# Patient Record
Sex: Male | Born: 1960
Health system: Southern US, Community
[De-identification: ages and names within clinical notes are randomized; demographics above are authoritative.]

## PROBLEM LIST (undated history)

## (undated) DIAGNOSIS — M1990 Unspecified osteoarthritis, unspecified site: Secondary | ICD-10-CM

## (undated) DIAGNOSIS — E785 Hyperlipidemia, unspecified: Secondary | ICD-10-CM

## (undated) HISTORY — DX: Hyperlipidemia, unspecified: E78.5

## (undated) HISTORY — DX: Unspecified osteoarthritis, unspecified site: M19.90

## (undated) HISTORY — PX: APPENDECTOMY: SHX54

---

## 2013-03-13 ENCOUNTER — Encounter: Payer: Self-pay | Admitting: Sports Medicine

## 2013-03-13 ENCOUNTER — Ambulatory Visit (INDEPENDENT_AMBULATORY_CARE_PROVIDER_SITE_OTHER): Payer: Managed Care, Other (non HMO) | Admitting: Sports Medicine

## 2013-03-13 VITALS — BP 112/74 | HR 63 | Wt 282.0 lb

## 2013-03-13 DIAGNOSIS — Z299 Encounter for prophylactic measures, unspecified: Secondary | ICD-10-CM

## 2013-03-13 DIAGNOSIS — R55 Syncope and collapse: Secondary | ICD-10-CM

## 2013-03-13 DIAGNOSIS — Z0000 Encounter for general adult medical examination without abnormal findings: Secondary | ICD-10-CM | POA: Insufficient documentation

## 2013-03-13 DIAGNOSIS — Z8619 Personal history of other infectious and parasitic diseases: Secondary | ICD-10-CM | POA: Insufficient documentation

## 2013-03-13 DIAGNOSIS — E669 Obesity, unspecified: Secondary | ICD-10-CM

## 2013-03-13 MED ORDER — PHENTERMINE HCL 37.5 MG PO CAPS: 37.5000 mg | ORAL_CAPSULE | ORAL | Status: DC

## 2013-03-13 NOTE — Assessment & Plan Note (Signed)
Symptoms sound predominately vasovagal. He recalls episodes of feeling flushed, dizzy, without any palpitations, chest pain, or other symptoms. Symptoms are not exertional.

## 2013-03-13 NOTE — Progress Notes (Signed)
  Subjective:    CC: Establish care.   HPI:  History of Lyme disease: Had polyarthralgias, was treated by antibiotics by a physician years ago, and symptoms resolved. Never had any blood tests.  Obesity: Has tried multiple diets and exercises to lose weight but is unable, wondering if phentermine would be acceptable.  Syncope: This is having twice in the past several years, he will get a sensation of flushing, did feel dizzy like he's going to pass out but never has palpitations, skipped beats, chest pain, pressure, nausea, shortness of breath. Symptoms are nonexertional, they're not relieved by rest. He does not recall any emotional stimuli during these episodes. Symptoms are brief, he has not had one in a long time. Past medical history, Surgical history, Family history not pertinant except as noted below, Social history, Allergies, and medications have been entered into the medical record, reviewed, and no changes needed.   Review of Systems: No headache, visual changes, nausea, vomiting, diarrhea, constipation, dizziness, abdominal pain, skin rash, fevers, chills, night sweats, swollen lymph nodes, weight loss, chest pain, body aches, joint swelling, muscle aches, shortness of breath, mood changes, visual or auditory hallucinations.  Objective:    General: Well Developed, well nourished, and in no acute distress.  Neuro: Alert and oriented x3, extra-ocular muscles intact, sensation grossly intact.  HEENT: Normocephalic, atraumatic, pupils equal round reactive to light, neck supple, no masses, no lymphadenopathy, thyroid nonpalpable.  Skin: Warm and dry, no rashes noted.  Cardiac: Regular rate and rhythm, no murmurs rubs or gallops.  Respiratory: Clear to auscultation bilaterally. Not using accessory muscles, speaking in full sentences.  Abdominal: Soft, nontender, nondistended, positive bowel sounds, no masses, no organomegaly.  Musculoskeletal: Shoulder, elbow, wrist, hip, knee, ankle  stable, and with full range of motion. Impression and Recommendations:    The patient was counselled, risk factors were discussed, anticipatory guidance given.

## 2013-03-13 NOTE — Assessment & Plan Note (Signed)
Checking routine bloodwork. 

## 2013-03-13 NOTE — Assessment & Plan Note (Signed)
Phentermine, nutritionist visit. Return monthly for weight checks.

## 2013-03-13 NOTE — Assessment & Plan Note (Signed)
Has never actually had Lyme titers done. Checking titers for Lyme, Rickettsia, Erhlichia. He does recall going on an antibiotic for 21 days.

## 2013-03-14 LAB — COMPREHENSIVE METABOLIC PANEL
ALT: 21 U/L (ref 0–53)
Albumin: 4.1 g/dL (ref 3.5–5.2)
CO2: 28 mEq/L (ref 19–32)
Calcium: 9 mg/dL (ref 8.4–10.5)
Chloride: 105 mEq/L (ref 96–112)
Glucose, Bld: 89 mg/dL (ref 70–99)
Potassium: 4.4 mEq/L (ref 3.5–5.3)
Sodium: 139 mEq/L (ref 135–145)
Total Protein: 6.3 g/dL (ref 6.0–8.3)

## 2013-03-14 LAB — CBC
HCT: 41.7 % (ref 39.0–52.0)
Hemoglobin: 14.6 g/dL (ref 13.0–17.0)
MCH: 31.6 pg (ref 26.0–34.0)
MCHC: 35 g/dL (ref 30.0–36.0)
MCV: 90.3 fL (ref 78.0–100.0)
Platelets: 239 K/uL (ref 150–400)
RBC: 4.62 MIL/uL (ref 4.22–5.81)
RDW: 14.6 % (ref 11.5–15.5)
WBC: 8.1 10*3/uL (ref 4.0–10.5)

## 2013-03-14 LAB — LIPID PANEL
Cholesterol: 204 mg/dL — ABNORMAL HIGH (ref 0–200)
HDL: 36 mg/dL — ABNORMAL LOW (ref >39)
LDL Cholesterol: 130 mg/dL — ABNORMAL HIGH (ref 0–99)
Total CHOL/HDL Ratio: 5.7 ratio
Triglycerides: 189 mg/dL — ABNORMAL HIGH (ref <150)
VLDL: 38 mg/dL (ref 0–40)

## 2013-03-14 LAB — HEMOGLOBIN A1C
Hgb A1c MFr Bld: 5.6 % (ref <5.7)
Mean Plasma Glucose: 114 mg/dL (ref <117)

## 2013-03-14 LAB — VITAMIN D 25 HYDROXY (VIT D DEFICIENCY, FRACTURES): Vit D, 25-Hydroxy: 42 ng/mL (ref 30–89)

## 2013-03-14 LAB — COMPREHENSIVE METABOLIC PANEL WITH GFR
AST: 17 U/L (ref 0–37)
Alkaline Phosphatase: 72 U/L (ref 39–117)
BUN: 13 mg/dL (ref 6–23)
Creat: 0.96 mg/dL (ref 0.50–1.35)
Total Bilirubin: 0.7 mg/dL (ref 0.3–1.2)

## 2013-03-14 LAB — TSH: TSH: 1.377 u[IU]/mL (ref 0.350–4.500)

## 2013-03-16 ENCOUNTER — Encounter: Payer: Self-pay | Admitting: Sports Medicine

## 2013-03-16 DIAGNOSIS — E291 Testicular hypofunction: Secondary | ICD-10-CM | POA: Insufficient documentation

## 2013-03-16 DIAGNOSIS — E785 Hyperlipidemia, unspecified: Secondary | ICD-10-CM | POA: Insufficient documentation

## 2013-03-16 LAB — ROCKY MTN SPOTTED FVR ABS PNL(IGG+IGM)
RMSF IgG: 1.44 IV — ABNORMAL HIGH
RMSF IgM: 0.27 IV

## 2013-03-16 LAB — EHRLICHIA ANTIBODY PANEL
E chaffeensis (HGE) Ab, IgG: NEGATIVE
E chaffeensis (HGE) Ab, IgM: NEGATIVE

## 2013-03-16 LAB — TESTOSTERONE, FREE, TOTAL, SHBG
Sex Hormone Binding: 22 nmol/L (ref 13–71)
Testosterone, Free: 35.3 pg/mL — ABNORMAL LOW (ref 47.0–244.0)
Testosterone-% Free: 2.3 % (ref 1.6–2.9)
Testosterone: 152 ng/dL — ABNORMAL LOW (ref 300–890)

## 2013-03-16 LAB — B. BURGDORFI ANTIBODIES: B burgdorferi Ab IgG+IgM: 0.29 {ISR}

## 2013-03-17 ENCOUNTER — Ambulatory Visit (INDEPENDENT_AMBULATORY_CARE_PROVIDER_SITE_OTHER): Payer: Managed Care, Other (non HMO) | Admitting: Sports Medicine

## 2013-03-17 ENCOUNTER — Encounter: Payer: Self-pay | Admitting: Sports Medicine

## 2013-03-17 VITALS — BP 115/78 | HR 87 | Wt 277.0 lb

## 2013-03-17 DIAGNOSIS — E291 Testicular hypofunction: Secondary | ICD-10-CM

## 2013-03-17 DIAGNOSIS — Z299 Encounter for prophylactic measures, unspecified: Secondary | ICD-10-CM

## 2013-03-17 DIAGNOSIS — E785 Hyperlipidemia, unspecified: Secondary | ICD-10-CM

## 2013-03-17 MED ORDER — TESTOSTERONE CYPIONATE 200 MG/ML IM SOLN
200.0000 mg | Freq: Once | INTRAMUSCULAR | Status: AC
  Administered 2013-03-17: 200 mg via INTRAMUSCULAR

## 2013-03-17 NOTE — Assessment & Plan Note (Addendum)
Starting testosterone supplementations. Testosterone 200 mg intramuscular Q2 weeks. Recheck in one month. Risks and benefits advised.  Increasing to 300 mg every 2 weeks, recheck in 4 weeks.

## 2013-03-17 NOTE — Patient Instructions (Addendum)
Exercise prescription:  You should adjust the intensity of your exercise based on your heart rate. The [employer] sports medicine recommends keeping your heart rate between 70-80% of its maximum for 30 minutes, 3-5 times per week. Maximum heart rate = (220 - age). Multiply this number by 0.75 to get your goal heart rate. If lower, then increase the intensity of your exercise. If the number is higher, you may decrease the intensity of your exercise.   Fat and Cholesterol Control Diet Cholesterol levels in your body are determined significantly by your diet. Cholesterol levels may also be related to heart disease. The following material helps to explain this relationship and discusses what you can do to help keep your heart healthy. Not all cholesterol is bad. Low-density lipoprotein (LDL) cholesterol is the "bad" cholesterol. It may cause fatty deposits to build up inside your arteries. High-density lipoprotein (HDL) cholesterol is "good." It helps to remove the "bad" LDL cholesterol from your blood. Cholesterol is a very important risk factor for heart disease. Other risk factors are high blood pressure, smoking, stress, heredity, and weight. The heart muscle gets its supply of blood through the coronary arteries. If your LDL cholesterol is high and your HDL cholesterol is low, you are at risk for having fatty deposits build up in your coronary arteries. This leaves less room through which blood can flow. Without sufficient blood and oxygen, the heart muscle cannot function properly and you may feel chest pains (angina pectoris). When a coronary artery closes up entirely, a part of the heart muscle may die causing a heart attack (myocardial infarction). CHECKING CHOLESTEROL When your caregiver sends your blood to a lab to be examined for cholesterol, a complete lipid (fat) profile may be done. With this test, the total amount of cholesterol and levels of LDL and HDL are determined. Triglycerides  are a type of fat that circulates in the blood. They can also be used to determine heart disease risk. The list below describes what the numbers should be: Test: Total Cholesterol.  Less than 200 mg/dl. Test: LDL "bad cholesterol."  Less than 100 mg/dl.  Less than 70 mg/dl if you are at very high risk of a heart attack or sudden cardiac death. Test: HDL "good cholesterol."  Greater than 50 mg/dl for women.  Greater than 40 mg/dl for men. Test: Triglycerides.  Less than 150 mg/dl. CONTROLLING CHOLESTEROL WITH DIET Although exercise and lifestyle factors are important, your diet is key. That is because certain foods are known to raise cholesterol and others to lower it. The goal is to balance foods for their effect on cholesterol and more importantly, to replace saturated and trans fat with other types of fat, such as monounsaturated fat, polyunsaturated fat, and omega-3 fatty acids. On average, a person should consume no more than 15 to 17 g of saturated fat daily. Saturated and trans fats are considered "bad" fats, and they will raise LDL cholesterol. Saturated fats are primarily found in animal products such as meats, butter, and cream. However, that does not mean you need to give up all your favorite foods. Today, there are good tasting, low-fat, low-cholesterol substitutes for most of the things you like to eat. Choose low-fat or nonfat alternatives. Choose round or loin cuts of red meat. These types of cuts are lowest in fat and cholesterol. Chicken (without the skin), fish, veal, and ground [place] breast are great choices. Eliminate fatty meats, such as hot dogs and salami. Even shellfish have little or no  saturated fat. Have a 3 oz (85 g) portion when you eat lean meat, poultry, or fish. Trans fats are also called "partially hydrogenated oils." They are oils that have been scientifically manipulated so that they are solid at room temperature resulting in a longer shelf life and improved  taste and texture of foods in which they are added. Trans fats are found in stick margarine, some tub margarines, cookies, crackers, and baked goods.  When baking and cooking, oils are a great substitute for butter. The monounsaturated oils are especially beneficial since it is believed they lower LDL and raise HDL. The oils you should avoid entirely are saturated tropical oils, such as coconut and palm.  Remember to eat a lot from food groups that are naturally free of saturated and trans fat, including fish, fruit, vegetables, beans, grains (barley, rice, couscous, bulgur wheat), and pasta (without cream sauces).  IDENTIFYING FOODS THAT LOWER CHOLESTEROL  Soluble fiber may lower your cholesterol. This type of fiber is found in fruits such as apples, vegetables such as broccoli, potatoes, and carrots, legumes such as beans, peas, and lentils, and grains such as barley. Foods fortified with plant sterols (phytosterol) may also lower cholesterol. You should eat at least 2 g per day of these foods for a cholesterol lowering effect.  Read package labels to identify low-saturated fats, trans fat free, and low-fat foods at the supermarket. Select cheeses that have only 2 to 3 g saturated fat per ounce. Use a heart-healthy tub margarine that is free of trans fats or partially hydrogenated oil. When buying baked goods (cookies, crackers), avoid partially hydrogenated oils. Breads and muffins should be made from whole grains (whole-wheat or whole oat flour, instead of "flour" or "enriched flour"). Buy non-creamy canned soups with reduced salt and no added fats.  FOOD PREPARATION TECHNIQUES  Never deep-fry. If you must fry, either stir-fry, which uses very little fat, or use non-stick cooking sprays. When possible, broil, bake, or roast meats, and steam vegetables. Instead of putting butter or margarine on vegetables, use lemon and herbs, applesauce, and cinnamon (for squash and sweet potatoes), nonfat yogurt, salsa,  and low-fat dressings for salads.  LOW-SATURATED FAT / LOW-FAT FOOD SUBSTITUTES Meats / Saturated Fat (g)  Avoid: Steak, marbled (3 oz/85 g) / 11 g  Choose: Steak, lean (3 oz/85 g) / 4 g  Avoid: Hamburger (3 oz/85 g) / 7 g  Choose: Hamburger, lean (3 oz/85 g) / 5 g  Avoid: Ham (3 oz/85 g) / 6 g  Choose: Ham, lean cut (3 oz/85 g) / 2.4 g  Avoid: Chicken, with skin, dark meat (3 oz/85 g) / 4 g  Choose: Chicken, skin removed, dark meat (3 oz/85 g) / 2 g  Avoid: Chicken, with skin, light meat (3 oz/85 g) / 2.5 g  Choose: Chicken, skin removed, light meat (3 oz/85 g) / 1 g Dairy / Saturated Fat (g)  Avoid: Whole milk (1 cup) / 5 g  Choose: Low-fat milk, 2% (1 cup) / 3 g  Choose: Low-fat milk, 1% (1 cup) / 1.5 g  Choose: Skim milk (1 cup) / 0.3 g  Avoid: Hard cheese (1 oz/28 g) / 6 g  Choose: Skim milk cheese (1 oz/28 g) / 2 to 3 g  Avoid: Cottage cheese, 4% fat (1 cup) / 6.5 g  Choose: Low-fat cottage cheese, 1% fat (1 cup) / 1.5 g  Avoid: Ice cream (1 cup) / 9 g  Choose: Sherbet (1 cup) / 2.5 g  Choose: Nonfat frozen yogurt (1 cup) / 0.3 g  Choose: Frozen fruit bar / trace  Avoid: Whipped cream (1 tbs) / 3.5 g  Choose: Nondairy whipped topping (1 tbs) / 1 g Condiments / Saturated Fat (g)  Avoid: Mayonnaise (1 tbs) / 2 g  Choose: Low-fat mayonnaise (1 tbs) / 1 g  Avoid: Butter (1 tbs) / 7 g  Choose: Extra light margarine (1 tbs) / 1 g  Avoid: Coconut oil (1 tbs) / 11.8 g  Choose: Olive oil (1 tbs) / 1.8 g  Choose: Corn oil (1 tbs) / 1.7 g  Choose: Safflower oil (1 tbs) / 1.2 g  Choose: Sunflower oil (1 tbs) / 1.4 g  Choose: Soybean oil (1 tbs) / 2.4 g  Choose: Canola oil (1 tbs) / 1 g Document Released: 10/01/2005 Document Revised: 12/24/2011 Document Reviewed: 03/22/2011 Exit[employer] Patient Information 2014 [place], [place].

## 2013-03-17 NOTE — Progress Notes (Signed)
  Subjective:    CC: Followup  HPI: Hypogonadism: does have fatigue, poor sex drive, no erectile dysfunction. Does desire to proceed with testosterone supplementation.  Hyperlipidemia: Like to try diet and exercise first.  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  Past medical history, Surgical history, Family history not pertinant except as noted below, Social history, Allergies, and medications have been entered into the medical record, reviewed, and no changes needed.   Review of Systems: No fevers, chills, night sweats, weight loss, chest pain, or shortness of breath.   Objective:    General: Well Developed, well nourished, and in no acute distress.  Neuro: Alert and oriented x3, extra-ocular muscles intact, sensation grossly intact.  HEENT: Normocephalic, atraumatic, pupils equal round reactive to light, neck supple, no masses, no lymphadenopathy, thyroid nonpalpable.  Skin: Warm and dry, no rashes. Cardiac: Regular rate and rhythm, no murmurs rubs or gallops, no lower extremity edema.  Respiratory: Clear to auscultation bilaterally. Not using accessory muscles, speaking in full sentences. Impression and Recommendations:

## 2013-03-17 NOTE — Assessment & Plan Note (Signed)
Diet, exercise prescription. Recheck in 3 months.

## 2013-03-31 ENCOUNTER — Encounter: Payer: Self-pay | Admitting: *Deleted

## 2013-03-31 ENCOUNTER — Ambulatory Visit (INDEPENDENT_AMBULATORY_CARE_PROVIDER_SITE_OTHER): Payer: Managed Care, Other (non HMO) | Admitting: Sports Medicine

## 2013-03-31 VITALS — BP 109/73 | HR 85 | Wt 274.0 lb

## 2013-03-31 DIAGNOSIS — E291 Testicular hypofunction: Secondary | ICD-10-CM

## 2013-03-31 MED ORDER — TESTOSTERONE CYPIONATE 200 MG/ML IM SOLN
200.0000 mg | Freq: Once | INTRAMUSCULAR | Status: AC
  Administered 2013-03-31: 200 mg via INTRAMUSCULAR

## 2013-03-31 NOTE — Progress Notes (Signed)
I was present for all essential parts of this visit and procedure. Ihor Austin. Benjamin Stain, M.D. testosterone injection.

## 2013-03-31 NOTE — Assessment & Plan Note (Signed)
Testosterone injection given, repeat in 2 weeks.

## 2013-04-08 LAB — CBC
HCT: 39.7 % (ref 39.0–52.0)
Hemoglobin: 13.9 g/dL (ref 13.0–17.0)
MCH: 30.9 pg (ref 26.0–34.0)
MCHC: 35 g/dL (ref 30.0–36.0)
MCV: 88.2 fL (ref 78.0–100.0)
Platelets: 221 10*3/uL (ref 150–400)
RBC: 4.5 MIL/uL (ref 4.22–5.81)
RDW: 14.3 % (ref 11.5–15.5)
WBC: 10.4 K/uL (ref 4.0–10.5)

## 2013-04-09 LAB — TESTOSTERONE, FREE, TOTAL, SHBG
Sex Hormone Binding: 22 nmol/L (ref 13–71)
Testosterone, Free: 79.6 pg/mL (ref 47.0–244.0)
Testosterone-% Free: 2.5 % (ref 1.6–2.9)
Testosterone: 324 ng/dL (ref 300–890)

## 2013-04-09 NOTE — Addendum Note (Signed)
Addended by: Monica Becton on: 04/09/2013 04:56 PM   Modules accepted: Orders

## 2013-04-10 ENCOUNTER — Ambulatory Visit (INDEPENDENT_AMBULATORY_CARE_PROVIDER_SITE_OTHER): Payer: Managed Care, Other (non HMO) | Admitting: Sports Medicine

## 2013-04-10 ENCOUNTER — Encounter: Payer: Self-pay | Admitting: Sports Medicine

## 2013-04-10 VITALS — BP 117/78 | HR 81 | Wt 273.0 lb

## 2013-04-10 DIAGNOSIS — E669 Obesity, unspecified: Secondary | ICD-10-CM

## 2013-04-10 DIAGNOSIS — J029 Acute pharyngitis, unspecified: Secondary | ICD-10-CM | POA: Insufficient documentation

## 2013-04-10 MED ORDER — PHENTERMINE HCL 37.5 MG PO CAPS: 37.5000 mg | ORAL_CAPSULE | ORAL | Status: DC

## 2013-04-10 NOTE — Progress Notes (Signed)
  Subjective:    CC: Followup  HPI: Obesity: 10 pound weight loss, doing well, no side effects from the medications.  Sore throat: present for a few days, pain is localized in the throat, no sick contacts, pain is mild, persistent, no fevers, chills, or GI symptoms.  Past medical history, Surgical history, Family history not pertinant except as noted below, Social history, Allergies, and medications have been entered into the medical record, reviewed, and no changes needed.   Review of Systems: No fevers, chills, night sweats, weight loss, chest pain, or shortness of breath.   Objective:    General: Well Developed, well nourished, and in no acute distress.  Neuro: Alert and oriented x3, extra-ocular muscles intact, sensation grossly intact.  HEENT: Normocephalic, atraumatic, pupils equal round reactive to light, neck supple, no masses, no lymphadenopathy, thyroid nonpalpable. Nasopharynx and extraocular canals are unremarkable, oropharynx is mildly erythematous, but there is no tonsillitis or exudates. Skin: Warm and dry, no rashes. Cardiac: Regular rate and rhythm, no murmurs rubs or gallops, no lower extremity edema.  Respiratory: Clear to auscultation bilaterally. Not using accessory muscles, speaking in full sentences. Impression and Recommendations:

## 2013-04-10 NOTE — Assessment & Plan Note (Signed)
Approximately 10 pound weight loss. Refilled phentermine. Return in one month for weight check and refills.

## 2013-04-10 NOTE — Assessment & Plan Note (Signed)
Is to be viral pharyngitis. Recommended over-the-counter analgesics, return as needed for this.

## 2013-04-14 ENCOUNTER — Ambulatory Visit: Payer: Managed Care, Other (non HMO) | Admitting: Sports Medicine

## 2013-04-14 ENCOUNTER — Encounter: Payer: Self-pay | Admitting: *Deleted

## 2013-04-14 ENCOUNTER — Ambulatory Visit (INDEPENDENT_AMBULATORY_CARE_PROVIDER_SITE_OTHER): Payer: Managed Care, Other (non HMO) | Admitting: Sports Medicine

## 2013-04-14 VITALS — BP 116/81 | HR 87

## 2013-04-14 DIAGNOSIS — E291 Testicular hypofunction: Secondary | ICD-10-CM

## 2013-04-14 MED ORDER — TESTOSTERONE CYPIONATE 200 MG/ML IM SOLN
300.0000 mg | Freq: Once | INTRAMUSCULAR | Status: AC
  Administered 2013-04-14: 300 mg via INTRAMUSCULAR

## 2013-04-14 NOTE — Assessment & Plan Note (Signed)
Testosterone injection as above. 

## 2013-04-14 NOTE — Progress Notes (Signed)
  Subjective:    Patient ID: Jose Jimenez, male    DOB: 06-Feb-1961, 52 y.o.   MRN: [id number] Testosterone injection given IM.  .  No complications.  Donne Anon, CMA HPI    Review of Systems     Objective:   Physical Exam        Assessment & Plan:  I was present for all essential parts of this visit and procedure. Ihor Austin. Benjamin Stain, M.D.

## 2013-04-27 ENCOUNTER — Other Ambulatory Visit: Payer: Self-pay | Admitting: *Deleted

## 2013-04-27 DIAGNOSIS — E291 Testicular hypofunction: Secondary | ICD-10-CM

## 2013-04-28 ENCOUNTER — Ambulatory Visit (INDEPENDENT_AMBULATORY_CARE_PROVIDER_SITE_OTHER): Payer: Managed Care, Other (non HMO) | Admitting: Sports Medicine

## 2013-04-28 VITALS — BP 109/76 | HR 93 | Wt 265.0 lb

## 2013-04-28 DIAGNOSIS — E291 Testicular hypofunction: Secondary | ICD-10-CM

## 2013-04-28 MED ORDER — TESTOSTERONE CYPIONATE 200 MG/ML IM SOLN
300.0000 mg | Freq: Once | INTRAMUSCULAR | Status: AC
  Administered 2013-04-28: 300 mg via INTRAMUSCULAR

## 2013-04-28 NOTE — Progress Notes (Signed)
  Subjective:    Patient ID: Jose Jimenez, male    DOB: [birth year], 52 y.o.   MRN: [id number] testosterone given IM.  No complications.  Donne Anon, CMA HPI    Review of Systems     Objective:   Physical Exam        Assessment & Plan:   I was present for all essential parts of this visit and procedure. Ihor Austin. Benjamin Stain, M.D.

## 2013-04-28 NOTE — Assessment & Plan Note (Signed)
Return in 2 weeks for repeat injection

## 2013-05-08 ENCOUNTER — Ambulatory Visit (INDEPENDENT_AMBULATORY_CARE_PROVIDER_SITE_OTHER): Payer: Managed Care, Other (non HMO) | Admitting: Sports Medicine

## 2013-05-08 ENCOUNTER — Encounter: Payer: Self-pay | Admitting: Sports Medicine

## 2013-05-08 VITALS — BP 120/87 | HR 82 | Wt 263.0 lb

## 2013-05-08 DIAGNOSIS — E291 Testicular hypofunction: Secondary | ICD-10-CM

## 2013-05-08 DIAGNOSIS — E669 Obesity, unspecified: Secondary | ICD-10-CM

## 2013-05-08 LAB — TESTOSTERONE, FREE, TOTAL, SHBG
Sex Hormone Binding: 24 nmol/L (ref 13–71)
Testosterone, Free: 208.3 pg/mL (ref 47.0–244.0)
Testosterone-% Free: 2.7 % (ref 1.6–2.9)
Testosterone: 779 ng/dL (ref 300–890)

## 2013-05-08 MED ORDER — PHENTERMINE HCL 37.5 MG PO CAPS
37.5000 mg | ORAL_CAPSULE | ORAL | Status: DC
Start: 2013-05-08 — End: 2013-07-27

## 2013-05-08 NOTE — Assessment & Plan Note (Signed)
Continue testosterone 300 mg. Symptomatically he is improving. Improve sex drive, energy, erections. At the next visit I would like him to do the shot himself, and we can then transition him to home injections.

## 2013-05-08 NOTE — Progress Notes (Signed)
  Subjective:    CC: Followup  HPI: Male hypogonadism: Most recent testosterone level was 700, he feels increased energy, increased sex drive, and improved erections.  Obesity: So far has had 20 pound weight loss in the last 2 months on phentermine. No adverse effects.  Past medical history, Surgical history, Family history not pertinant except as noted below, Social history, Allergies, and medications have been entered into the medical record, reviewed, and no changes needed.   Review of Systems: No fevers, chills, night sweats, weight loss, chest pain, or shortness of breath.   Objective:    General: Well Developed, well nourished, and in no acute distress.  Neuro: Alert and oriented x3, extra-ocular muscles intact, sensation grossly intact.  HEENT: Normocephalic, atraumatic, pupils equal round reactive to light, neck supple, no masses, no lymphadenopathy, thyroid nonpalpable.  Skin: Warm and dry, no rashes. Cardiac: Regular rate and rhythm, no murmurs rubs or gallops, no lower extremity edema.  Respiratory: Clear to auscultation bilaterally. Not using accessory muscles, speaking in full sentences.  Impression and Recommendations:

## 2013-05-08 NOTE — Assessment & Plan Note (Signed)
Approximately 20 pound weight loss since starting phentermine. No adverse effects. Refilling, return in one month for weight check and refills.

## 2013-05-12 ENCOUNTER — Ambulatory Visit (INDEPENDENT_AMBULATORY_CARE_PROVIDER_SITE_OTHER): Payer: Managed Care, Other (non HMO) | Admitting: Sports Medicine

## 2013-05-12 VITALS — BP 132/87 | HR 83

## 2013-05-12 DIAGNOSIS — E291 Testicular hypofunction: Secondary | ICD-10-CM

## 2013-05-12 MED ORDER — TESTOSTERONE CYPIONATE 200 MG/ML IM SOLN
300.0000 mg | INTRAMUSCULAR | Status: DC
  Administered 2013-05-12: 300 mg via INTRAMUSCULAR

## 2013-05-12 NOTE — Progress Notes (Signed)
  Subjective:    Patient ID: Jose Jimenez, male    DOB: Apr 18, 1961, 52 y.o.   MRN: [id number]  HPI   Here to be instructed on how to give testosterone injection per Dr. Benjamin Stain to initiate home injections Review of Systems     Objective:   Physical Exam        Assessment & Plan:  Instructed pt to give injection in upper outer ventrogluteal area. Showed him a picture im injection sites of ventrogluteal and trocahnter area. Showed him the greater trochanter area which can also be used for IM injections if he didn't feel comfortable doing ventrogluteal area. Advised pt to alternate injection sites each time he gave injection and to clean area with alcohol before injection. Pt didn't want to give himself an injection today because he said his finger was hurting. I administered the injection in the rt ventrogluteal without complication.  I was present for all essential parts of this visit and procedure. Ihor Austin. Benjamin Stain, M.D.

## 2013-05-12 NOTE — Assessment & Plan Note (Signed)
Testosterone injection as above. Patient was taught how to administer the injection himself, he will let me know if he desires to do this I can send in testosterone as well as syringes and needles.

## 2013-05-15 ENCOUNTER — Encounter: Payer: Self-pay | Admitting: Sports Medicine

## 2013-05-15 ENCOUNTER — Ambulatory Visit (INDEPENDENT_AMBULATORY_CARE_PROVIDER_SITE_OTHER): Payer: Managed Care, Other (non HMO) | Admitting: Sports Medicine

## 2013-05-15 VITALS — BP 118/83 | HR 93 | Wt 266.0 lb

## 2013-05-15 DIAGNOSIS — IMO0001 Reserved for inherently not codable concepts without codable children: Secondary | ICD-10-CM | POA: Insufficient documentation

## 2013-05-15 DIAGNOSIS — L03019 Cellulitis of unspecified finger: Secondary | ICD-10-CM

## 2013-05-15 MED ORDER — DOXYCYCLINE HYCLATE 100 MG PO TABS: 100.0000 mg | ORAL_TABLET | Freq: Two times a day (BID) | ORAL | Status: DC

## 2013-05-15 MED ORDER — HYDROCODONE-ACETAMINOPHEN 10-325 MG PO TABS: 1.0000 | ORAL_TABLET | Freq: Three times a day (TID) | ORAL | Status: DC | PRN

## 2013-05-15 NOTE — Patient Instructions (Addendum)
Paronychia Paronychia is an inflammatory reaction involving the folds of the skin surrounding the fingernail. This is commonly caused by an infection in the skin around a nail. The most common cause of paronychia is frequent wetting of the hands (as seen with bartenders, food servers, nurses or others who wet their hands). This makes the skin around the fingernail susceptible to infection by bacteria (germs) or fungus. Other predisposing factors are:  Aggressive manicuring.  Nail biting.  Thumb sucking. The most common cause is a staphylococcal (a type of germ) infection, or a fungal (Candida) infection. When caused by a germ, it usually comes on suddenly with redness, swelling, pus and is often painful. It may get under the nail and form an abscess (collection of pus), or form an abscess around the nail. If the nail itself is infected with a fungus, the treatment is usually prolonged and may require oral medicine for up to one year. Your caregiver will determine the length of time treatment is required. The paronychia caused by bacteria (germs) may largely be avoided by not pulling on hangnails or picking at cuticles. When the infection occurs at the tips of the finger it is called felon. When the cause of paronychia is from the herpes simplex virus (HSV) it is called herpetic whitlow. TREATMENT  When an abscess is present treatment is often incision and drainage. This means that the abscess must be cut open so the pus can get out. When this is done, the following home care instructions should be followed. HOME CARE INSTRUCTIONS   It is important to keep the affected fingers very dry. Rubber or plastic gloves over cotton gloves should be used whenever the hand must be placed in water.  Keep wound clean, dry and dressed as suggested by your caregiver between warm soaks or warm compresses.  Soak in warm water for fifteen to twenty minutes three to four times per day for bacterial infections. Fungal  infections are very difficult to treat, so often require treatment for long periods of time.  For bacterial (germ) infections take antibiotics (medicine which kill germs) as directed and finish the prescription, even if the problem appears to be solved before the medicine is gone.  Only take over-the-counter or prescription medicines for pain, discomfort, or fever as directed by your caregiver. SEEK IMMEDIATE MEDICAL CARE IF:  You have redness, swelling, or increasing pain in the wound.  You notice pus coming from the wound.  You have a fever.  You notice a bad smell coming from the wound or dressing. Document Released: 03/27/2001 Document Revised: 12/24/2011 Document Reviewed: 11/26/2008 [employer] Patient Information 2014 [employer].  

## 2013-05-15 NOTE — Assessment & Plan Note (Signed)
Switching to doxycycline. Increasing hydrocodone to 10/325. Continue soaks. Return in one week, incision and drainage if no better.

## 2013-05-15 NOTE — Progress Notes (Signed)
  Subjective:    CC: Finger pain  HPI: For the past few days Jose Jimenez is noted increased pain and swelling at the tip of his right middle finger. He went to the emergency department and was found to have a paronychia He was given Keflex, and hydrocodone 5/325 which is ineffective. Pain is localized, severe, does not radiate. No constitutional symptoms. Worse with any type of movement or palpation.  Past medical history, Surgical history, Family history not pertinant except as noted below, Social history, Allergies, and medications have been entered into the medical record, reviewed, and no changes needed.   Review of Systems: No fevers, chills, night sweats, weight loss, chest pain, or shortness of breath.   Objective:    General: Well Developed, well nourished, and in no acute distress.  Neuro: Alert and oriented x3, extra-ocular muscles intact, sensation grossly intact.  HEENT: Normocephalic, atraumatic, pupils equal round reactive to light, neck supple, no masses, no lymphadenopathy, thyroid nonpalpable.  Skin: Warm and dry, no rashes. Cardiac: Regular rate and rhythm, no murmurs rubs or gallops, no lower extremity edema.  Respiratory: Clear to auscultation bilaterally. Not using accessory muscles, speaking in full sentences. Right hand: There is erythema over the lateral nail fold, with some visible purulence underneath the nail. Neurovascularly intact distally.  Impression and Recommendations:

## 2013-05-22 ENCOUNTER — Encounter: Payer: Self-pay | Admitting: Sports Medicine

## 2013-05-22 ENCOUNTER — Ambulatory Visit (INDEPENDENT_AMBULATORY_CARE_PROVIDER_SITE_OTHER): Payer: Managed Care, Other (non HMO) | Admitting: Sports Medicine

## 2013-05-22 VITALS — BP 132/78 | HR 104 | Wt 260.0 lb

## 2013-05-22 DIAGNOSIS — IMO0001 Reserved for inherently not codable concepts without codable children: Secondary | ICD-10-CM

## 2013-05-22 DIAGNOSIS — L03019 Cellulitis of unspecified finger: Secondary | ICD-10-CM

## 2013-05-22 MED ORDER — DOXYCYCLINE HYCLATE 100 MG PO TABS: 100.0000 mg | ORAL_TABLET | Freq: Two times a day (BID) | ORAL | Status: AC

## 2013-05-22 NOTE — Progress Notes (Signed)
  Subjective:    CC: Followup  HPI: Right third finger paronychia: I initially started Fayrene Fearing on doxycycline, his pain is overall improving but he does note some increasing sloughing of the skin, he actually partially drained the lesion himself with a needle at home. Pain is moderate, persistent, localized, he still has sensation of the distal tip of the finger. No constitutional symptoms.  Past medical history, Surgical history, Family history not pertinant except as noted below, Social history, Allergies, and medications have been entered into the medical record, reviewed, and no changes needed.   Review of Systems: No fevers, chills, night sweats, weight loss, chest pain, or shortness of breath.   Objective:    General: Well Developed, well nourished, and in no acute distress.  Neuro: Alert and oriented x3, extra-ocular muscles intact, sensation grossly intact.  HEENT: Normocephalic, atraumatic, pupils equal round reactive to light, neck supple, no masses, no lymphadenopathy, thyroid nonpalpable.  Skin: Warm and dry, no rashes. Cardiac: Regular rate and rhythm, no murmurs rubs or gallops, no lower extremity edema.  Respiratory: Clear to auscultation bilaterally. Not using accessory muscles, speaking in full sentences. Right third finger shows a fairly severe paronychia with sloughing of the skin at the tip of the digit, purulence under the lateral fingernail, and copious granulation. The actual fingernail is fairly friable, and I do think it will separate with time.  Procedure:  Complicated removal of right third lateral fingernail as well as incision and drainage due to paronychia. Risks, benefits, alternatives explained to patient. Consent obtained. Time out conducted. Noted no overlying induration or erythema at site of injection. Finger cleaned with alcohol and Betadine, then a total of 5 cc lidocaine 2% infiltrated at adjacent webspaces at the location of the bifurcation of the  common digital nerve to proper digital nerves.   Adequate anesthesia ensured. Finger prepped and draped in a sterile fashion. Nail elevator used to separate nail plate from nail bed. Clippers used to cut toenail in a longitudinal fashion to proximal nail fold and matrix. Hemostat then used to separate nail fragment from surrounding structures. Minor bleeding controlled with pressure. Antibiotic ointment applied. Finger dressed. Advised to return if increased redness, swelling, drainage, fevers, or chills.  Impression and Recommendations:

## 2013-05-22 NOTE — Assessment & Plan Note (Addendum)
Incision and drainage with removal of the lateral nail plate. He will return to see me in one week, and then to see one of my partners a week after that. I am going to refill doxycycline for an additional 7 day course. Pain is well controlled with hydrocodone.

## 2013-05-26 ENCOUNTER — Ambulatory Visit (INDEPENDENT_AMBULATORY_CARE_PROVIDER_SITE_OTHER): Payer: Managed Care, Other (non HMO) | Admitting: Sports Medicine

## 2013-05-26 ENCOUNTER — Encounter: Payer: Self-pay | Admitting: *Deleted

## 2013-05-26 VITALS — BP 119/81 | HR 79 | Wt 260.0 lb

## 2013-05-26 DIAGNOSIS — IMO0001 Reserved for inherently not codable concepts without codable children: Secondary | ICD-10-CM

## 2013-05-26 DIAGNOSIS — E291 Testicular hypofunction: Secondary | ICD-10-CM

## 2013-05-26 MED ORDER — SULFAMETHOXAZOLE-TRIMETHOPRIM 800-160 MG PO TABS: 1.0000 | ORAL_TABLET | Freq: Two times a day (BID) | ORAL | Status: AC

## 2013-05-26 MED ORDER — TESTOSTERONE CYPIONATE 200 MG/ML IM SOLN
300.0000 mg | INTRAMUSCULAR | Status: DC
  Administered 2013-05-26: 300 mg via INTRAMUSCULAR

## 2013-05-26 NOTE — Progress Notes (Signed)
Patient ID: Jose Jimenez, male   DOB: March 28, 1961, 52 y.o.   MRN: [id number] Pt was seen in office for Testosterone injection. 300 ml was given left ventragluteral area. There was no signs of complication.   I was present for all essential parts of this visit and procedure. Ihor Austin. Benjamin Stain, M.D.

## 2013-05-28 ENCOUNTER — Encounter: Payer: Self-pay | Admitting: Sports Medicine

## 2013-05-28 ENCOUNTER — Ambulatory Visit (INDEPENDENT_AMBULATORY_CARE_PROVIDER_SITE_OTHER): Payer: Managed Care, Other (non HMO) | Admitting: Sports Medicine

## 2013-05-28 VITALS — BP 118/82 | HR 79 | Wt 261.0 lb

## 2013-05-28 DIAGNOSIS — IMO0001 Reserved for inherently not codable concepts without codable children: Secondary | ICD-10-CM

## 2013-05-28 DIAGNOSIS — L03019 Cellulitis of unspecified finger: Secondary | ICD-10-CM

## 2013-05-28 NOTE — Progress Notes (Signed)
  Subjective:    CC: Follow up  HPI: 6 days status post incision and drainage with partial nail removal of a right third finger paronychia. Finishing antibiotics. Overall improving significantly.  Past medical history, Surgical history, Family history not pertinant except as noted below, Social history, Allergies, and medications have been entered into the medical record, reviewed, and no changes needed.   Review of Systems: No fevers, chills, night sweats, weight loss, chest pain, or shortness of breath.   Objective:    General: Well Developed, well nourished, and in no acute distress.  Neuro: Alert and oriented x3, extra-ocular muscles intact, sensation grossly intact.  HEENT: Normocephalic, atraumatic, pupils equal round reactive to light, neck supple, no masses, no lymphadenopathy, thyroid nonpalpable.  Skin: Warm and dry, no rashes. Cardiac: Regular rate and rhythm, no murmurs rubs or gallops, no lower extremity edema.  Respiratory: Clear to auscultation bilaterally. Not using accessory muscles, speaking in full sentences. Finger was examined, there is no further erythema, purulence, and tenderness is improving significantly. Skin continues to heal.  Impression and Recommendations:

## 2013-05-28 NOTE — Assessment & Plan Note (Signed)
Greatly improved after incision, drainage, and partial nail removal. Continue antibiotics. Continue warm soaks. Keep clean. Return to see me in one month.

## 2013-06-09 ENCOUNTER — Ambulatory Visit (INDEPENDENT_AMBULATORY_CARE_PROVIDER_SITE_OTHER): Payer: Managed Care, Other (non HMO) | Admitting: Sports Medicine

## 2013-06-09 ENCOUNTER — Encounter: Payer: Self-pay | Admitting: *Deleted

## 2013-06-09 VITALS — BP 124/81 | HR 71

## 2013-06-09 DIAGNOSIS — E291 Testicular hypofunction: Secondary | ICD-10-CM

## 2013-06-09 MED ORDER — TESTOSTERONE CYPIONATE 200 MG/ML IM SOLN
300.0000 mg | Freq: Once | INTRAMUSCULAR | Status: AC
  Administered 2013-06-09: 300 mg via INTRAMUSCULAR

## 2013-06-09 NOTE — Assessment & Plan Note (Signed)
Testosterone injection as above. 

## 2013-06-09 NOTE — Progress Notes (Signed)
I was present for all essential parts of this visit and procedure.   J. , M.D.   

## 2013-06-29 ENCOUNTER — Ambulatory Visit (INDEPENDENT_AMBULATORY_CARE_PROVIDER_SITE_OTHER): Payer: Managed Care, Other (non HMO) | Admitting: Sports Medicine

## 2013-06-29 ENCOUNTER — Encounter: Payer: Self-pay | Admitting: Sports Medicine

## 2013-06-29 VITALS — BP 129/82 | HR 85 | Wt 257.0 lb

## 2013-06-29 DIAGNOSIS — B9789 Other viral agents as the cause of diseases classified elsewhere: Secondary | ICD-10-CM

## 2013-06-29 DIAGNOSIS — E291 Testicular hypofunction: Secondary | ICD-10-CM

## 2013-06-29 DIAGNOSIS — R110 Nausea: Secondary | ICD-10-CM

## 2013-06-29 DIAGNOSIS — IMO0001 Reserved for inherently not codable concepts without codable children: Secondary | ICD-10-CM

## 2013-06-29 DIAGNOSIS — B349 Viral infection, unspecified: Secondary | ICD-10-CM | POA: Insufficient documentation

## 2013-06-29 MED ORDER — PROMETHAZINE HCL 25 MG PO TABS: 25.0000 mg | ORAL_TABLET | Freq: Four times a day (QID) | ORAL | Status: DC | PRN

## 2013-06-29 MED ORDER — PROMETHAZINE HCL 25 MG/ML IJ SOLN
25.0000 mg | Freq: Once | INTRAMUSCULAR | Status: AC
  Administered 2013-06-29: 25 mg via INTRAMUSCULAR

## 2013-06-29 MED ORDER — TESTOSTERONE CYPIONATE 200 MG/ML IM SOLN
200.0000 mg | INTRAMUSCULAR | Status: DC
  Administered 2013-06-29: 200 mg via INTRAMUSCULAR

## 2013-06-29 NOTE — Progress Notes (Signed)
  Subjective:    CC: Follow up  HPI: Paronychia: Resolved, skin peeling has finished, fingernail remains slightly dystrophic but otherwise he is very happy with the results.  Nausea: Present after a trip to [place], nausea, mild diarrhea, muscle aches, body aches. No hematochezia, melena, or hematemesis. No rashes. No sick contacts. Nausea is his principal symptoms. No abdominal pain.  Hypogonadism colon due for a testosterone injection today.  Past medical history, Surgical history, Family history not pertinant except as noted below, Social history, Allergies, and medications have been entered into the medical record, reviewed, and no changes needed.   Review of Systems: No fevers, chills, night sweats, weight loss, chest pain, or shortness of breath.   Objective:    General: Well Developed, well nourished, and in no acute distress.  Neuro: Alert and oriented x3, extra-ocular muscles intact, sensation grossly intact.  HEENT: Normocephalic, atraumatic, pupils equal round reactive to light, neck supple, no masses, no lymphadenopathy, thyroid nonpalpable.  Skin: Warm and dry, no rashes. Fingernail appears normal, fingernail is still slightly dystrophic. Cardiac: Regular rate and rhythm, no murmurs rubs or gallops, no lower extremity edema.  Respiratory: Clear to auscultation bilaterally. Not using accessory muscles, speaking in full sentences. Abdomen: Soft, nontender, nondistended, normal bowel sounds, no palpable masses.  Impression and Recommendations:

## 2013-06-29 NOTE — Assessment & Plan Note (Signed)
Predominantly nausea, some muscle aches and body aches. Phenergan intramuscular. Phenergan oral, and he can do over-the-counter analgesics for muscle aches.

## 2013-06-29 NOTE — Assessment & Plan Note (Signed)
Resolved

## 2013-06-29 NOTE — Assessment & Plan Note (Signed)
Testosterone injection given today. 

## 2013-07-13 ENCOUNTER — Ambulatory Visit (INDEPENDENT_AMBULATORY_CARE_PROVIDER_SITE_OTHER): Payer: Managed Care, Other (non HMO) | Admitting: Sports Medicine

## 2013-07-13 ENCOUNTER — Encounter: Payer: Self-pay | Admitting: *Deleted

## 2013-07-13 VITALS — BP 115/78 | HR 68

## 2013-07-13 DIAGNOSIS — E291 Testicular hypofunction: Secondary | ICD-10-CM

## 2013-07-13 MED ORDER — TESTOSTERONE CYPIONATE 200 MG/ML IM SOLN
200.0000 mg | Freq: Once | INTRAMUSCULAR | Status: AC
  Administered 2013-07-13: 200 mg via INTRAMUSCULAR

## 2013-07-13 NOTE — Progress Notes (Signed)
  Subjective:    Patient ID: Jose Jimenez, male    DOB: 1961-03-27, 52 y.o.   MRN: [id number]  HPI  Here for testosterone injection.  Review of Systems     Objective:   Physical Exam        Assessment & Plan:  Given without complication  I was present for all essential parts of this visit and procedure. Ihor Austin. Benjamin Stain, M.D.

## 2013-07-13 NOTE — Assessment & Plan Note (Signed)
Testosterone injection as above. This was given slightly early, we will continue in a every 2 week routine from today.

## 2013-07-27 ENCOUNTER — Encounter: Payer: Self-pay | Admitting: Sports Medicine

## 2013-07-27 ENCOUNTER — Ambulatory Visit (INDEPENDENT_AMBULATORY_CARE_PROVIDER_SITE_OTHER): Payer: Managed Care, Other (non HMO) | Admitting: Sports Medicine

## 2013-07-27 VITALS — BP 120/79 | HR 82 | Wt 257.0 lb

## 2013-07-27 DIAGNOSIS — Z23 Encounter for immunization: Secondary | ICD-10-CM

## 2013-07-27 DIAGNOSIS — E291 Testicular hypofunction: Secondary | ICD-10-CM

## 2013-07-27 DIAGNOSIS — E669 Obesity, unspecified: Secondary | ICD-10-CM

## 2013-07-27 DIAGNOSIS — L03019 Cellulitis of unspecified finger: Secondary | ICD-10-CM

## 2013-07-27 DIAGNOSIS — IMO0001 Reserved for inherently not codable concepts without codable children: Secondary | ICD-10-CM

## 2013-07-27 DIAGNOSIS — Z299 Encounter for prophylactic measures, unspecified: Secondary | ICD-10-CM

## 2013-07-27 DIAGNOSIS — E785 Hyperlipidemia, unspecified: Secondary | ICD-10-CM

## 2013-07-27 MED ORDER — PHENTERMINE HCL 37.5 MG PO CAPS: 37.5000 mg | ORAL_CAPSULE | ORAL | Status: DC

## 2013-07-27 MED ORDER — TESTOSTERONE CYPIONATE 200 MG/ML IM SOLN
200.0000 mg | INTRAMUSCULAR | Status: DC
  Administered 2013-07-27: 200 mg via INTRAMUSCULAR

## 2013-07-27 NOTE — Assessment & Plan Note (Signed)
Declines flu vaccine, tetanus shot given today. Return for complete physical.

## 2013-07-27 NOTE — Assessment & Plan Note (Signed)
Rechecking lipids since he has lost significant weight on phentermine.

## 2013-07-27 NOTE — Assessment & Plan Note (Signed)
Testosterone injection given today. 

## 2013-07-27 NOTE — Assessment & Plan Note (Signed)
100% resolved. 

## 2013-07-27 NOTE — Assessment & Plan Note (Signed)
Weight is essentially the same at the last visit, he is been doing phentermine at one half tab and intermittently. He will increase to one full tab daily. Return in one month for a weight check and refills.

## 2013-07-27 NOTE — Progress Notes (Signed)
  Subjective:    CC: Follow up  HPI: Paronychia: Resolved.  Male hypogonadism: Testosterone injection given today.  Hyperlipidemia: Currently working on weight loss and dietary modifications, do for a lipid check.  Obesity: Excellent initial weight loss and phentermine, he has only been taking one half pill intermittently and his weight is the same as it was at the first visit.  Past medical history, Surgical history, Family history not pertinant except as noted below, Social history, Allergies, and medications have been entered into the medical record, reviewed, and no changes needed.   Review of Systems: No fevers, chills, night sweats, weight loss, chest pain, or shortness of breath.   Objective:    General: Well Developed, well nourished, and in no acute distress.  Neuro: Alert and oriented x3, extra-ocular muscles intact, sensation grossly intact.  HEENT: Normocephalic, atraumatic, pupils equal round reactive to light, neck supple, no masses, no lymphadenopathy, thyroid nonpalpable.  Skin: Warm and dry, no rashes. Cardiac: Regular rate and rhythm, no murmurs rubs or gallops, no lower extremity edema.  Respiratory: Clear to auscultation bilaterally. Not using accessory muscles, speaking in full sentences. Impression and Recommendations:

## 2013-08-10 ENCOUNTER — Encounter: Payer: Self-pay | Admitting: Sports Medicine

## 2013-08-10 ENCOUNTER — Ambulatory Visit (INDEPENDENT_AMBULATORY_CARE_PROVIDER_SITE_OTHER): Payer: Managed Care, Other (non HMO) | Admitting: Sports Medicine

## 2013-08-10 VITALS — BP 137/84 | HR 84 | Wt 250.0 lb

## 2013-08-10 DIAGNOSIS — L821 Other seborrheic keratosis: Secondary | ICD-10-CM | POA: Insufficient documentation

## 2013-08-10 DIAGNOSIS — E785 Hyperlipidemia, unspecified: Secondary | ICD-10-CM

## 2013-08-10 DIAGNOSIS — Z0000 Encounter for general adult medical examination without abnormal findings: Secondary | ICD-10-CM

## 2013-08-10 DIAGNOSIS — Z1211 Encounter for screening for malignant neoplasm of colon: Secondary | ICD-10-CM

## 2013-08-10 DIAGNOSIS — Z299 Encounter for prophylactic measures, unspecified: Secondary | ICD-10-CM

## 2013-08-10 DIAGNOSIS — E669 Obesity, unspecified: Secondary | ICD-10-CM

## 2013-08-10 DIAGNOSIS — L989 Disorder of the skin and subcutaneous tissue, unspecified: Secondary | ICD-10-CM

## 2013-08-10 DIAGNOSIS — E291 Testicular hypofunction: Secondary | ICD-10-CM

## 2013-08-10 LAB — HEMOCCULT GUIAC POC 1CARD (OFFICE): Fecal Occult Blood, POC: NEGATIVE

## 2013-08-10 MED ORDER — TESTOSTERONE CYPIONATE 200 MG/ML IM SOLN
300.0000 mg | INTRAMUSCULAR | Status: DC
  Administered 2013-08-10: 300 mg via INTRAMUSCULAR

## 2013-08-10 MED ORDER — TESTOSTERONE CYPIONATE 200 MG/ML IM SOLN: 200.0000 mg | INTRAMUSCULAR | Status: DC

## 2013-08-10 MED ORDER — PHENTERMINE HCL 37.5 MG PO CAPS: 37.5000 mg | ORAL_CAPSULE | ORAL | Status: DC

## 2013-08-10 NOTE — Assessment & Plan Note (Signed)
Punch biopsy as above. Return in one week for suture removal.

## 2013-08-10 NOTE — Addendum Note (Signed)
Addended by: Pixie Casino on: 08/10/2013 11:34 AM   Modules accepted: Orders

## 2013-08-10 NOTE — Assessment & Plan Note (Signed)
Continue testosterone supplementation, he is considering transition to home injections.

## 2013-08-10 NOTE — Assessment & Plan Note (Signed)
Refilling weight-loss medication.

## 2013-08-10 NOTE — Addendum Note (Signed)
Addended by: Pixie Casino on: 08/10/2013 03:51 PM   Modules accepted: Orders

## 2013-08-10 NOTE — Progress Notes (Signed)
  Subjective:    CC: CPE  HPI:  Patient presents for complete physical.  Obesity: Good weight loss with phentermine, refilling.  Hyperlipidemia: Rechecking. Stable.  Skin lesion on back: Right scapular. No pain, no bleeding, popped up a months ago.  Past medical history, Surgical history, Family history not pertinant except as noted below, Social history, Allergies, and medications have been entered into the medical record, reviewed, and no changes needed.   Review of Systems: No headache, visual changes, nausea, vomiting, diarrhea, constipation, dizziness, abdominal pain, skin rash, fevers, chills, night sweats, swollen lymph nodes, weight loss, chest pain, body aches, joint swelling, muscle aches, shortness of breath, mood changes, visual or auditory hallucinations.  Objective:    General: Well Developed, well nourished, and in no acute distress.  Neuro: Alert and oriented x3, extra-ocular muscles intact, sensation grossly intact.  HEENT: Normocephalic, atraumatic, pupils equal round reactive to light, neck supple, no masses, no lymphadenopathy, thyroid nonpalpable.  Skin: Warm and dry, no rashes noted. There is an actinic keratosis noted over his right scapula. Cardiac: Regular rate and rhythm, no murmurs rubs or gallops.  Respiratory: Clear to auscultation bilaterally. Not using accessory muscles, speaking in full sentences.  Abdominal: Soft, nontender, nondistended, positive bowel sounds, no masses, no organomegaly.  Musculoskeletal: Shoulder, elbow, wrist, hip, knee, ankle stable, and with full range of motion. Rectal: Good tone, prostate smooth, Hemoccult negative.  Procedure:  Excision of 0.5 cm right scapular lesion. Risks, benefits, and alternatives explained and consent obtained. Time out conducted. Surface prepped with alcohol. 5cc lidocaine with epinephine infiltrated in a field block. Adequate anesthesia ensured. Area prepped and draped in a sterile fashion. Excision  performed with: 4 mm punch biopsy, a single 4-0 Ethilon suture was placed to close the wound. Hemostasis achieved. Pt stable.  Impression and Recommendations:    The patient was counselled, risk factors were discussed, anticipatory guidance given.

## 2013-08-10 NOTE — Assessment & Plan Note (Signed)
Rechecking lipids. 

## 2013-08-10 NOTE — Assessment & Plan Note (Signed)
Complete physical today. Return in one year.

## 2013-08-15 LAB — COMPREHENSIVE METABOLIC PANEL WITH GFR
ALT: 19 U/L (ref 0–53)
AST: 17 U/L (ref 0–37)
Creat: 0.87 mg/dL (ref 0.50–1.35)
Sodium: 137 meq/L (ref 135–145)
Total Bilirubin: 0.7 mg/dL (ref 0.3–1.2)
Total Protein: 6.6 g/dL (ref 6.0–8.3)

## 2013-08-15 LAB — LIPID PANEL
Cholesterol: 173 mg/dL (ref 0–200)
HDL: 36 mg/dL — ABNORMAL LOW (ref >39)
LDL Cholesterol: 112 mg/dL — ABNORMAL HIGH (ref 0–99)
Total CHOL/HDL Ratio: 4.8 Ratio
Triglycerides: 124 mg/dL (ref <150)
VLDL: 25 mg/dL (ref 0–40)

## 2013-08-15 LAB — COMPREHENSIVE METABOLIC PANEL
Albumin: 4.1 g/dL (ref 3.5–5.2)
Alkaline Phosphatase: 62 U/L (ref 39–117)
BUN: 13 mg/dL (ref 6–23)
CO2: 26 mEq/L (ref 19–32)
Calcium: 9.2 mg/dL (ref 8.4–10.5)
Chloride: 99 mEq/L (ref 96–112)
Glucose, Bld: 45 mg/dL — ABNORMAL LOW (ref 70–99)
Potassium: 4.6 mEq/L (ref 3.5–5.3)

## 2013-08-15 LAB — CBC
HCT: 47.3 % (ref 39.0–52.0)
Hemoglobin: 17.1 g/dL — ABNORMAL HIGH (ref 13.0–17.0)
MCH: 33.4 pg (ref 26.0–34.0)
MCHC: 36.2 g/dL — ABNORMAL HIGH (ref 30.0–36.0)
MCV: 92.4 fL (ref 78.0–100.0)
Platelets: 254 10*3/uL (ref 150–400)
RBC: 5.12 MIL/uL (ref 4.22–5.81)
RDW: 15 % (ref 11.5–15.5)
WBC: 9.5 K/uL (ref 4.0–10.5)

## 2013-08-17 ENCOUNTER — Ambulatory Visit: Payer: Managed Care, Other (non HMO) | Admitting: Sports Medicine

## 2013-08-17 ENCOUNTER — Encounter: Payer: Self-pay | Admitting: Sports Medicine

## 2013-08-17 VITALS — BP 130/81 | HR 101 | Wt 256.0 lb

## 2013-08-17 DIAGNOSIS — E291 Testicular hypofunction: Secondary | ICD-10-CM

## 2013-08-17 DIAGNOSIS — L989 Disorder of the skin and subcutaneous tissue, unspecified: Secondary | ICD-10-CM

## 2013-08-17 DIAGNOSIS — E785 Hyperlipidemia, unspecified: Secondary | ICD-10-CM

## 2013-08-17 NOTE — Assessment & Plan Note (Signed)
Is going to transition to home injections, we will allow him to do the injection himself at the next visit.

## 2013-08-17 NOTE — Progress Notes (Signed)
  Subjective: One week status post punch biopsy of a skin lesion. Doing well, no irritation, no pain.   Objective: General: Well-developed, well-nourished, and in no acute distress. Wound looks good, single simple interrupted suture removed.  Pathology results confirmed actinic keratosis. No dysplastic features.   Assessment/plan:

## 2013-08-17 NOTE — Assessment & Plan Note (Signed)
Pathology results showed actinic keratosis as expected. Sutures removed, return as needed.

## 2013-08-17 NOTE — Assessment & Plan Note (Signed)
Overall stable. LDL is within normal range. He could increase his exercise, HDL is slightly low.

## 2013-08-24 ENCOUNTER — Ambulatory Visit (INDEPENDENT_AMBULATORY_CARE_PROVIDER_SITE_OTHER): Payer: Managed Care, Other (non HMO) | Admitting: Sports Medicine

## 2013-08-24 ENCOUNTER — Encounter: Payer: Self-pay | Admitting: *Deleted

## 2013-08-24 VITALS — BP 125/76 | HR 76

## 2013-08-24 DIAGNOSIS — E291 Testicular hypofunction: Secondary | ICD-10-CM

## 2013-08-24 MED ORDER — TESTOSTERONE CYPIONATE 200 MG/ML IM SOLN
200.0000 mg | Freq: Once | INTRAMUSCULAR | Status: AC
  Administered 2013-08-24: 200 mg via INTRAMUSCULAR

## 2013-08-24 NOTE — Progress Notes (Signed)
  Subjective:    Patient ID: Jose Jimenez, male    DOB: 1961-06-01, 52 y.o.   MRN: [id number] Testosterone injection given IM RUOQ. No complications. Donne Anon, CMA HPI    Review of Systems     Objective:   Physical Exam        Assessment & Plan:

## 2013-08-24 NOTE — Assessment & Plan Note (Signed)
Testosterone injection as above. 

## 2013-09-07 ENCOUNTER — Encounter: Payer: Self-pay | Admitting: *Deleted

## 2013-09-07 ENCOUNTER — Ambulatory Visit (INDEPENDENT_AMBULATORY_CARE_PROVIDER_SITE_OTHER): Payer: Managed Care, Other (non HMO) | Admitting: Sports Medicine

## 2013-09-07 VITALS — BP 124/85 | HR 90

## 2013-09-07 DIAGNOSIS — E291 Testicular hypofunction: Secondary | ICD-10-CM

## 2013-09-07 MED ORDER — TESTOSTERONE CYPIONATE 200 MG/ML IM SOLN
300.0000 mg | Freq: Once | INTRAMUSCULAR | Status: AC
  Administered 2013-09-07: 300 mg via INTRAMUSCULAR

## 2013-09-07 NOTE — Progress Notes (Signed)
  Subjective:    Patient ID: Jose Jimenez, male    DOB: 1961-07-24, 52 y.o.   MRN: [id number] Testosterone 300mg  injection given in LUOQ w/no complications.  Meyer Cory, LPN  HPI    Review of Systems     Objective:   Physical Exam        Assessment & Plan:

## 2013-09-07 NOTE — Assessment & Plan Note (Signed)
Testosterone injection as above. 

## 2013-09-21 ENCOUNTER — Encounter: Payer: Self-pay | Admitting: *Deleted

## 2013-09-21 ENCOUNTER — Ambulatory Visit (INDEPENDENT_AMBULATORY_CARE_PROVIDER_SITE_OTHER): Payer: Managed Care, Other (non HMO) | Admitting: Sports Medicine

## 2013-09-21 VITALS — BP 113/79 | HR 78 | Wt 256.0 lb

## 2013-09-21 DIAGNOSIS — E291 Testicular hypofunction: Secondary | ICD-10-CM

## 2013-09-21 MED ORDER — TESTOSTERONE CYPIONATE 200 MG/ML IM SOLN
300.0000 mg | Freq: Once | INTRAMUSCULAR | Status: AC
  Administered 2013-09-21: 300 mg via INTRAMUSCULAR

## 2013-09-21 NOTE — Assessment & Plan Note (Signed)
Testosterone injection as above. 

## 2013-09-21 NOTE — Progress Notes (Signed)
   Subjective:    Patient ID: Jose Jimenez, male    DOB: Jun 25, 1961, 52 y.o.   MRN: [id number] Testosterone injection given IM RUOQ.  . No complications. Pt is requesting a refill on his phentermine.  Donne Anon, CMA HPI    Review of Systems     Objective:   Physical Exam        Assessment & Plan:

## 2013-10-05 ENCOUNTER — Ambulatory Visit (INDEPENDENT_AMBULATORY_CARE_PROVIDER_SITE_OTHER): Payer: Managed Care, Other (non HMO) | Admitting: Sports Medicine

## 2013-10-05 ENCOUNTER — Encounter: Payer: Self-pay | Admitting: *Deleted

## 2013-10-05 VITALS — BP 126/81 | HR 71 | Wt 256.0 lb

## 2013-10-05 DIAGNOSIS — E291 Testicular hypofunction: Secondary | ICD-10-CM

## 2013-10-05 MED ORDER — TESTOSTERONE CYPIONATE 200 MG/ML IM SOLN
200.0000 mg | Freq: Once | INTRAMUSCULAR | Status: AC
  Administered 2013-10-05: 200 mg via INTRAMUSCULAR

## 2013-10-05 NOTE — Progress Notes (Signed)
I was present for all essential parts of this visit and procedure.   J. , M.D.   

## 2013-10-05 NOTE — Assessment & Plan Note (Signed)
Testosterone injection as above. 

## 2013-10-20 ENCOUNTER — Ambulatory Visit (INDEPENDENT_AMBULATORY_CARE_PROVIDER_SITE_OTHER): Payer: BC Managed Care – PPO | Admitting: Sports Medicine

## 2013-10-20 ENCOUNTER — Encounter: Payer: Self-pay | Admitting: *Deleted

## 2013-10-20 VITALS — BP 126/76 | HR 91

## 2013-10-20 DIAGNOSIS — E291 Testicular hypofunction: Secondary | ICD-10-CM

## 2013-10-20 MED ORDER — TESTOSTERONE CYPIONATE 200 MG/ML IM SOLN
200.0000 mg | Freq: Once | INTRAMUSCULAR | Status: AC
  Administered 2013-10-20: 300 mg via INTRAMUSCULAR

## 2013-10-20 NOTE — Assessment & Plan Note (Signed)
Testosterone injection as above. 

## 2013-10-20 NOTE — Progress Notes (Signed)
   Subjective:    Patient ID: Jose Jimenez, male    DOB: 09/07/1961, 53 y.o.   MRN: [id number] [facility]  administered in RUOQ per Dr. Benjamin Stainhekkekandam. Donne AnonAmber , CMA HPI    Review of Systems     Objective:   Physical Exam        Assessment & Plan:

## 2013-10-20 NOTE — Progress Notes (Signed)
Testosterone injection given. 

## 2013-11-02 ENCOUNTER — Encounter: Payer: Self-pay | Admitting: Sports Medicine

## 2013-11-02 ENCOUNTER — Ambulatory Visit (INDEPENDENT_AMBULATORY_CARE_PROVIDER_SITE_OTHER): Payer: BC Managed Care – PPO | Admitting: Sports Medicine

## 2013-11-02 VITALS — BP 117/81 | HR 83 | Wt 242.0 lb

## 2013-11-02 DIAGNOSIS — E291 Testicular hypofunction: Secondary | ICD-10-CM

## 2013-11-02 DIAGNOSIS — E669 Obesity, unspecified: Secondary | ICD-10-CM

## 2013-11-02 MED ORDER — TESTOSTERONE CYPIONATE 200 MG/ML IM SOLN: 300.0000 mg | INTRAMUSCULAR | Status: DC

## 2013-11-02 MED ORDER — AMBULATORY NON FORMULARY MEDICATION: Status: DC

## 2013-11-02 MED ORDER — TESTOSTERONE CYPIONATE 200 MG/ML IM SOLN
300.0000 mg | INTRAMUSCULAR | Status: DC
  Administered 2013-11-02: 300 mg via INTRAMUSCULAR

## 2013-11-02 NOTE — Assessment & Plan Note (Signed)
Excellent 40 pound weight loss since we started phentermine. He is now going to come off of it.

## 2013-11-02 NOTE — Progress Notes (Signed)
  Subjective:    CC: Followup  HPI: Obesity: 40 pound weight loss since we started phentermine, he has since come off the medicine and is either continue to try maintaining his weight without pharmacologic assistance.  Male hypogonadism: Fantastic improvement in symptoms with testosterone supplementation, most recent levels were 700. At this point he is transitioning to home injections.    Past medical history, Surgical history, Family history not pertinant except as noted below, Social history, Allergies, and medications have been entered into the medical record, reviewed, and no changes needed.   Review of Systems: No fevers, chills, night sweats, weight loss, chest pain, or shortness of breath.   Objective:    General: Well Developed, well nourished, and in no acute distress.  Neuro: Alert and oriented x3, extra-ocular muscles intact, sensation grossly intact.  HEENT: Normocephalic, atraumatic, pupils equal round reactive to light, neck supple, no masses, no lymphadenopathy, thyroid nonpalpable.  Skin: Warm and dry, no rashes. Cardiac: Regular rate and rhythm, no murmurs rubs or gallops, no lower extremity edema.  Respiratory: Clear to auscultation bilaterally. Not using accessory muscles, speaking in full sentences.  Impression and Recommendations:    I spent 25 minutes with the patient, greater than 50% was face-to-face time counseling regarding the above diagnoses and teaching him how to do home testosterone injection.

## 2013-11-02 NOTE — Addendum Note (Signed)
Addended by: Willey BladeUNNINGHAM, RHONDA C on: 11/02/2013 11:46 AM   Modules accepted: Orders

## 2013-11-02 NOTE — Assessment & Plan Note (Addendum)
We will teach Jose Jimenez how to do testosterone injections himself into the anterior thigh. I would like to see him back in 6 months to recheck testosterone levels and CBC.

## 2013-11-02 NOTE — Progress Notes (Signed)
Patient was trained for to give himself Testosterone injection. He gave himself 1.5 ml Depo Testosterone  In the right anterior thigh. Patient had no problems with giving himself the injection. Rhonda Cunningham,CMA

## 2014-04-21 ENCOUNTER — Encounter: Payer: Self-pay | Admitting: Sports Medicine

## 2014-04-21 ENCOUNTER — Ambulatory Visit (INDEPENDENT_AMBULATORY_CARE_PROVIDER_SITE_OTHER): Payer: BC Managed Care – PPO | Admitting: Sports Medicine

## 2014-04-21 VITALS — BP 131/82 | HR 68 | Ht 75.0 in | Wt 261.0 lb

## 2014-04-21 DIAGNOSIS — E669 Obesity, unspecified: Secondary | ICD-10-CM

## 2014-04-21 DIAGNOSIS — L821 Other seborrheic keratosis: Secondary | ICD-10-CM

## 2014-04-21 MED ORDER — PHENTERMINE HCL 37.5 MG PO TABS: 18.7500 mg | ORAL_TABLET | Freq: Every day | ORAL | Status: DC

## 2014-04-21 NOTE — Patient Instructions (Signed)
Seborrheic Keratosis Seborrheic keratosis is a common, noncancerous (benign) skin growth that can occur anywhere on the skin.It looks like "stuck-on," waxy, rough, tan, brown, or black spots on the skin. These skin growths can be flat or raised.They are often called "barnacles" because of their pasted-on appearance.Usually, these skin growths appear in adulthood, around age 53, and increase in number as you age. They may also develop during pregnancy or following estrogen therapy. Many people may only have one growth appear in their lifetime, while some people may develop many growths. CAUSES It is unknown what causes these skin growths, but they appear to run in families. SYMPTOMS Seborrheic keratosis is often located on the face, chest, shoulders, back, or other areas. These growths are:  Usually painless, but may become irritated and itchy.  Yellow, brown, black, or other colors.  Slightly raised or have a flat surface.  Sometimes rough or wart-like in texture.  Often waxy on the surface.  Round or oval-shaped.  Sometimes "stuck-on" in appearance.  Sometimes single, but there are usually many growths. Any growth that bleeds, itches on a regular basis, becomes inflamed, or becomes irritated needs to be evaluated by a skin specialist (dermatologist). DIAGNOSIS Diagnosis is mainly based on the way the growths appear. In some cases, it can be difficult to tell this type of skin growth from skin cancer. A skin growth tissue sample (biopsy) may be used to confirm the diagnosis. TREATMENT Most often, treatment is not needed because the skin growths are benign.If the skin growth is irritated easily by clothing or jewelry, causing it to scab or bleed, treatment may be recommended. Patients may also choose to have the growths removed because they do not like their appearance. Most commonly, these growths are treated with cryosurgery. In cryosurgery, liquid nitrogen is applied to "freeze" the  growth. The growth usually falls off within a matter of days. A blister may form and dry into a scab that will also fall off. After the growth or scab falls off, it may leave a dark or light spot on the skin. This color may fade over time, or it may remain permanent on the skin. HOME CARE INSTRUCTIONS If the skin growths are treated with cryosurgery, the treated area needs to be kept clean with water and soap. SEEK MEDICAL CARE IF:  You have questions about these growths or other skin problems.  You develop new symptoms, including:  A change in the appearance of the skin growth.  New growths.  Any bleeding, itching, or pain in the growths.  A skin growth that looks similar to seborrheic keratosis. Document Released: 11/03/2010 Document Revised: 12/24/2011 Document Reviewed: 11/03/2010 [employer] Patient Information 2015 [employer]. This information is not intended to replace advice given to you by your health care provider. Make sure you discuss any questions you have with your health care provider.  

## 2014-04-21 NOTE — Progress Notes (Signed)
  Subjective:    CC: Skin lesion  HPI: This is a very pleasant 53 year old male, for the past several months he has had a dark lesion growing on the left side of his scalp behind the ear. It is not painful, he just wonders what it is.  Past medical history, Surgical history, Family history not pertinant except as noted below, Social history, Allergies, and medications have been entered into the medical record, reviewed, and no changes needed.   Review of Systems: No fevers, chills, night sweats, weight loss, chest pain, or shortness of breath.   Objective:    General: Well Developed, well nourished, and in no acute distress.  Neuro: Alert and oriented x3, extra-ocular muscles intact, sensation grossly intact.  HEENT: Normocephalic, atraumatic, pupils equal round reactive to light, neck supple, no masses, no lymphadenopathy, thyroid nonpalpable.  Skin: Warm and dry, no rashes. There is a 1.5 cm seborrheic keratosis behind his left ear Cardiac: Regular rate and rhythm, no murmurs rubs or gallops, no lower extremity edema.  Respiratory: Clear to auscultation bilaterally. Not using accessory muscles, speaking in full sentences.  Cryotherapy template Procedure:  Cryodestruction of left scalp seborrheic keratosis Consent obtained and verified. Time-out conducted. Noted no overlying erythema, induration, or other signs of local infection. The patient did not tolerate initial use of cryotherapy so we placed him 1 cc of lidocaine with epinephrine under the lesion in a field block. Completed without difficulty using Cryo-Gun. Advised to call if fevers/chills, erythema, induration, drainage, or persistent bleeding.  Impression and Recommendations:

## 2014-04-21 NOTE — Assessment & Plan Note (Signed)
Cryotherapy as above. Return as needed.

## 2014-04-21 NOTE — Assessment & Plan Note (Signed)
40 pound weight loss on phentermine, he has gained 19 pounds back since coming off. Re-adding half tab. Return in 3 months.

## 2014-05-03 ENCOUNTER — Ambulatory Visit: Payer: BC Managed Care – PPO | Admitting: Sports Medicine

## 2014-05-06 ENCOUNTER — Other Ambulatory Visit: Payer: Self-pay

## 2014-05-06 ENCOUNTER — Other Ambulatory Visit: Payer: Self-pay | Admitting: Sports Medicine

## 2014-05-06 DIAGNOSIS — E291 Testicular hypofunction: Secondary | ICD-10-CM

## 2014-05-07 ENCOUNTER — Ambulatory Visit (INDEPENDENT_AMBULATORY_CARE_PROVIDER_SITE_OTHER): Payer: BC Managed Care – PPO | Admitting: Sports Medicine

## 2014-05-07 ENCOUNTER — Encounter: Payer: Self-pay | Admitting: Sports Medicine

## 2014-05-07 VITALS — BP 116/79 | HR 108 | Ht 75.0 in | Wt 250.4 lb

## 2014-05-07 DIAGNOSIS — E669 Obesity, unspecified: Secondary | ICD-10-CM | POA: Diagnosis not present

## 2014-05-07 DIAGNOSIS — E291 Testicular hypofunction: Secondary | ICD-10-CM

## 2014-05-07 DIAGNOSIS — L821 Other seborrheic keratosis: Secondary | ICD-10-CM | POA: Diagnosis not present

## 2014-05-07 LAB — LIPID PANEL
Cholesterol: 194 mg/dL (ref 0–200)
HDL: 35 mg/dL — ABNORMAL LOW (ref >39)
LDL Cholesterol: 120 mg/dL — ABNORMAL HIGH (ref 0–99)
Total CHOL/HDL Ratio: 5.5 Ratio
Triglycerides: 195 mg/dL — ABNORMAL HIGH (ref <150)
VLDL: 39 mg/dL (ref 0–40)

## 2014-05-07 LAB — COMPREHENSIVE METABOLIC PANEL
ALT: 17 U/L (ref 0–53)
Albumin: 4.4 g/dL (ref 3.5–5.2)
Alkaline Phosphatase: 49 U/L (ref 39–117)
BUN: 12 mg/dL (ref 6–23)
CO2: 28 mEq/L (ref 19–32)
Calcium: 9.8 mg/dL (ref 8.4–10.5)
Glucose, Bld: 86 mg/dL (ref 70–99)
Potassium: 4.4 mEq/L (ref 3.5–5.3)
Sodium: 135 mEq/L (ref 135–145)
Total Bilirubin: 0.9 mg/dL (ref 0.2–1.2)
Total Protein: 6.8 g/dL (ref 6.0–8.3)

## 2014-05-07 LAB — CBC
HCT: 48.9 % (ref 39.0–52.0)
Hemoglobin: 17.7 g/dL — ABNORMAL HIGH (ref 13.0–17.0)
MCH: 33.1 pg (ref 26.0–34.0)
MCHC: 36.2 g/dL — ABNORMAL HIGH (ref 30.0–36.0)
MCV: 91.6 fL (ref 78.0–100.0)
Platelets: 230 K/uL (ref 150–400)
RBC: 5.34 MIL/uL (ref 4.22–5.81)
RDW: 14.1 % (ref 11.5–15.5)
WBC: 7.8 10*3/uL (ref 4.0–10.5)

## 2014-05-07 LAB — PSA, TOTAL AND FREE
PSA, Free Pct: 22 % — ABNORMAL LOW (ref >25)
PSA, Free: 0.73 ng/mL
PSA: 3.31 ng/mL (ref <=4.00)

## 2014-05-07 LAB — COMPREHENSIVE METABOLIC PANEL WITH GFR
AST: 16 U/L (ref 0–37)
Chloride: 99 meq/L (ref 96–112)
Creat: 1.05 mg/dL (ref 0.50–1.35)

## 2014-05-07 LAB — TESTOSTERONE: Testosterone: 968 ng/dL — ABNORMAL HIGH (ref 300–890)

## 2014-05-07 MED ORDER — TESTOSTERONE CYPIONATE 200 MG/ML IM SOLN: 300.0000 mg | INTRAMUSCULAR | Status: DC

## 2014-05-07 NOTE — Assessment & Plan Note (Signed)
Continued excellent weight loss.

## 2014-05-07 NOTE — Progress Notes (Signed)
  Subjective:    CC: Followup  HPI: Hyperlipidemia: Stable  Hypogonadism: Doing home injections, doing very well.  Obesity: Continues to lose weight on low dose phentermine.  Seborrheic keratosis: Resolved after cryotherapy at the last visit.  Past medical history, Surgical history, Family history not pertinant except as noted below, Social history, Allergies, and medications have been entered into the medical record, reviewed, and no changes needed.   Review of Systems: No fevers, chills, night sweats, weight loss, chest pain, or shortness of breath.   Objective:    General: Well Developed, well nourished, and in no acute distress.  Neuro: Alert and oriented x3, extra-ocular muscles intact, sensation grossly intact.  HEENT: Normocephalic, atraumatic, pupils equal round reactive to light, neck supple, no masses, no lymphadenopathy, thyroid nonpalpable.  Skin: Warm and dry, no rashes. Cardiac: Regular rate and rhythm, no murmurs rubs or gallops, no lower extremity edema.  Respiratory: Clear to auscultation bilaterally. Not using accessory muscles, speaking in full sentences.  Impression and Recommendations:

## 2014-05-07 NOTE — Assessment & Plan Note (Signed)
Resolved after cryotherapy.

## 2014-05-07 NOTE — Assessment & Plan Note (Signed)
Continue home injections.

## 2014-06-16 ENCOUNTER — Encounter: Payer: Self-pay | Admitting: Family Medicine

## 2014-06-16 ENCOUNTER — Ambulatory Visit (INDEPENDENT_AMBULATORY_CARE_PROVIDER_SITE_OTHER): Payer: BC Managed Care – PPO | Admitting: Family Medicine

## 2014-06-16 VITALS — BP 125/80 | HR 97 | Temp 97.5°F | Wt 253.0 lb

## 2014-06-16 DIAGNOSIS — J309 Allergic rhinitis, unspecified: Secondary | ICD-10-CM | POA: Diagnosis not present

## 2014-06-16 NOTE — Progress Notes (Signed)
CC: Jose Jimenez is a 53 y.o. male is here for Sinusitis   Subjective: HPI:  Nasal congestion for the past 3 months on a daily basis present all hours of the day but worse first thing in the morning. At its best only mild in severity. No interventions as of yet.  Nothing seems to make better or worse that he has identified.  Symptoms are persistent throughout the day. 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   Review Of Systems Outlined In HPI  No past medical history on file.  Past Surgical History  Procedure Laterality Date  . Appendectomy     No family history on file.  History   Social History  . Marital Status: Married    Spouse Name: N/A    Number of Children: N/A  . Years of Education: N/A   Occupational History  . Not on file.   Social History Main Topics  . Smoking status: Former Games developer  . Smokeless tobacco: Not on file  . Alcohol Use: No  . Drug Use: No  . Sexual Activity: Yes   Other Topics Concern  . Not on file   Social History Narrative  . No narrative on file     Objective: BP 125/80  Pulse 97  Temp(Src) 97.5 F (36.4 C) (Oral)  Wt 253 lb (114.76 kg)  General: Alert and Oriented, No Acute Distress HEENT: Pupils equal, round, reactive to light. Conjunctivae clear.  External ears unremarkable, canals clear with intact TMs with appropriate landmarks.  Middle ear appears open without effusion. Pink inferior turbinates with mild mucoid discharge.  Moist mucous membranes, pharynx without inflammation nor lesions.  Neck supple without palpable lymphadenopathy nor abnormal masses. Lungs: Clear to auscultation bilaterally, no wheezing/ronchi/rales.  Comfortable work of breathing. Good air movement. Cardiac: Regular rate and rhythm. Normal S1/S2.  No murmurs, rubs, nor gallops.   Mental Status: No depression,  anxiety, nor agitation. Skin: Warm and dry.  Assessment & Plan: Fortino was seen today for sinusitis.  Diagnoses and associated orders for this visit:  Allergic sinusitis    Reassurance provided I do not think there is any bacterial or viral component contributing to his symptoms. Most likely allergic sinusitis therefore start either Nasacort or Flonase which ever one is cheaper over-the-counter. Call if no better in one week.  Return if symptoms worsen or fail to improve.

## 2014-11-08 ENCOUNTER — Ambulatory Visit: Payer: BC Managed Care – PPO | Admitting: Sports Medicine

## 2014-11-15 ENCOUNTER — Ambulatory Visit (INDEPENDENT_AMBULATORY_CARE_PROVIDER_SITE_OTHER): Payer: BLUE CROSS/BLUE SHIELD | Admitting: Sports Medicine

## 2014-11-15 ENCOUNTER — Encounter: Payer: Self-pay | Admitting: Sports Medicine

## 2014-11-15 VITALS — BP 109/70 | HR 75 | Wt 252.0 lb

## 2014-11-15 DIAGNOSIS — E291 Testicular hypofunction: Secondary | ICD-10-CM

## 2014-11-15 DIAGNOSIS — E785 Hyperlipidemia, unspecified: Secondary | ICD-10-CM | POA: Diagnosis not present

## 2014-11-15 DIAGNOSIS — E669 Obesity, unspecified: Secondary | ICD-10-CM | POA: Diagnosis not present

## 2014-11-15 DIAGNOSIS — N139 Obstructive and reflux uropathy, unspecified: Secondary | ICD-10-CM

## 2014-11-15 MED ORDER — TAMSULOSIN HCL 0.4 MG PO CAPS: 0.4000 mg | ORAL_CAPSULE | Freq: Every day | ORAL | Status: DC

## 2014-11-15 MED ORDER — TESTOSTERONE CYPIONATE 200 MG/ML IM SOLN: 300.0000 mg | INTRAMUSCULAR | Status: DC

## 2014-11-15 NOTE — Assessment & Plan Note (Signed)
Rechecking lipid panel 

## 2014-11-15 NOTE — Assessment & Plan Note (Signed)
Rechecking testosterone and PSA levels, however I am going to add Flomax in the meantime.

## 2014-11-15 NOTE — Assessment & Plan Note (Signed)
Doing well, weight is stable.

## 2014-11-15 NOTE — Patient Instructions (Signed)
Call me in a month to let me know how the Flomax is doing.

## 2014-11-15 NOTE — Progress Notes (Signed)
  Subjective:    CC: Six-month follow-up  HPI: Male hypogonadism: Needs a refill on testosterone for home injections.  Obesity: Weight is stable.  Obstructive uropathy: Voiding through the night, dribbling, weak stream, voiding multiple times through the day. This was present before we started testosterone supplementation. He does not think the testosterone has worsened.  Hyperlipidemia: Mildly elevated on a previous check, he has lost significant weight so he is amenable to check it again.  Past medical history, Surgical history, Family history not pertinant except as noted below, Social history, Allergies, and medications have been entered into the medical record, reviewed, and no changes needed.   Review of Systems: No fevers, chills, night sweats, weight loss, chest pain, or shortness of breath.   Objective:    General: Well Developed, well nourished, and in no acute distress.  Neuro: Alert and oriented x3, extra-ocular muscles intact, sensation grossly intact.  HEENT: Normocephalic, atraumatic, pupils equal round reactive to light, neck supple, no masses, no lymphadenopathy, thyroid nonpalpable.  Skin: Warm and dry, no rashes. Cardiac: Regular rate and rhythm, no murmurs rubs or gallops, no lower extremity edema.  Respiratory: Clear to auscultation bilaterally. Not using accessory muscles, speaking in full sentences.  Impression and Recommendations:

## 2014-11-15 NOTE — Assessment & Plan Note (Signed)
Continue home injections. Refilling testosterone. 1 week after his second home injection we will recheck labs.

## 2015-05-16 ENCOUNTER — Other Ambulatory Visit: Payer: Self-pay | Admitting: Sports Medicine

## 2015-05-16 ENCOUNTER — Ambulatory Visit: Payer: BLUE CROSS/BLUE SHIELD | Admitting: Sports Medicine

## 2015-05-16 DIAGNOSIS — Z299 Encounter for prophylactic measures, unspecified: Secondary | ICD-10-CM

## 2015-05-16 DIAGNOSIS — E291 Testicular hypofunction: Secondary | ICD-10-CM

## 2015-05-16 DIAGNOSIS — E785 Hyperlipidemia, unspecified: Secondary | ICD-10-CM

## 2015-05-16 LAB — LIPID PANEL
Cholesterol: 189 mg/dL (ref 125–200)
HDL: 38 mg/dL — ABNORMAL LOW (ref >=40)
LDL Cholesterol: 117 mg/dL (ref <130)
Total CHOL/HDL Ratio: 5 Ratio (ref <=5.0)
Triglycerides: 168 mg/dL — ABNORMAL HIGH (ref <150)
VLDL: 34 mg/dL — ABNORMAL HIGH (ref <30)

## 2015-05-16 LAB — COMPLETE METABOLIC PANEL WITH GFR
ALT: 15 U/L (ref 9–46)
Albumin: 4.2 g/dL (ref 3.6–5.1)
Calcium: 9.3 mg/dL (ref 8.6–10.3)
Chloride: 102 mmol/L (ref 98–110)
Creat: 0.86 mg/dL (ref 0.70–1.33)
GFR, Est Non African American: >89 mL/min (ref >=60)
Glucose, Bld: 94 mg/dL (ref 65–99)
Potassium: 4.3 mmol/L (ref 3.5–5.3)
Sodium: 138 mmol/L (ref 135–146)
Total Bilirubin: 0.9 mg/dL (ref 0.2–1.2)
Total Protein: 6.5 g/dL (ref 6.1–8.1)

## 2015-05-16 LAB — CBC
HCT: 41.8 % (ref 39.0–52.0)
Hemoglobin: 15 g/dL (ref 13.0–17.0)
MCH: 32.9 pg (ref 26.0–34.0)
MCHC: 35.9 g/dL (ref 30.0–36.0)
MCV: 91.7 fL (ref 78.0–100.0)
MPV: 10.3 fL (ref 8.6–12.4)
Platelets: 249 10*3/uL (ref 150–400)
RBC: 4.56 MIL/uL (ref 4.22–5.81)
RDW: 14 % (ref 11.5–15.5)
WBC: 7.1 10*3/uL (ref 4.0–10.5)

## 2015-05-16 LAB — COMPLETE METABOLIC PANEL WITHOUT GFR
AST: 14 U/L (ref 10–35)
Alkaline Phosphatase: 55 U/L (ref 40–115)
BUN: 15 mg/dL (ref 7–25)
CO2: 24 mmol/L (ref 20–31)
GFR, Est African American: >89 mL/min (ref >=60)

## 2015-05-16 NOTE — Progress Notes (Signed)
Pt did not get lab work ordered in 2/16 completed, new orders had to be placed.

## 2015-05-17 LAB — PSA, TOTAL AND FREE
PSA, Free Pct: 18 % — ABNORMAL LOW (ref >25)
PSA, Free: 0.53 ng/mL
PSA: 2.89 ng/mL (ref <=4.00)

## 2015-05-17 LAB — TESTOSTERONE: Testosterone: 289 ng/dL — ABNORMAL LOW (ref 300–890)

## 2015-05-19 ENCOUNTER — Ambulatory Visit (INDEPENDENT_AMBULATORY_CARE_PROVIDER_SITE_OTHER): Payer: BLUE CROSS/BLUE SHIELD | Admitting: Sports Medicine

## 2015-05-19 ENCOUNTER — Telehealth: Payer: Self-pay

## 2015-05-19 ENCOUNTER — Encounter: Payer: Self-pay | Admitting: Sports Medicine

## 2015-05-19 VITALS — BP 119/81 | HR 98 | Ht 75.0 in | Wt 251.0 lb

## 2015-05-19 DIAGNOSIS — E291 Testicular hypofunction: Secondary | ICD-10-CM | POA: Diagnosis not present

## 2015-05-19 DIAGNOSIS — E785 Hyperlipidemia, unspecified: Secondary | ICD-10-CM

## 2015-05-19 DIAGNOSIS — N139 Obstructive and reflux uropathy, unspecified: Secondary | ICD-10-CM

## 2015-05-19 MED ORDER — TESTOSTERONE CYPIONATE 200 MG/ML IM SOLN
300.0000 mg | INTRAMUSCULAR | Status: DC
  Administered 2015-05-19: 300 mg via INTRAMUSCULAR

## 2015-05-19 MED ORDER — TESTOSTERONE CYPIONATE 200 MG/ML IM SOLN: 300.0000 mg | INTRAMUSCULAR | Status: DC

## 2015-05-19 NOTE — Progress Notes (Signed)
  Subjective:    CC:  Follow-up  HPI:  Male hypogonadism: Has been off testosterone for some time, desires to restart.   Hyperlipidemia: Stable   obstructive uropathy: Did well with Flomax however has not needed it recently with controlling beverage consumption around bedtime  Past medical history, Surgical history, Family history not pertinant except as noted below, Social history, Allergies, and medications have been entered into the medical record, reviewed, and no changes needed.   Review of Systems: No fevers, chills, night sweats, weight loss, chest pain, or shortness of breath.   Objective:    General: Well Developed, well nourished, and in no acute distress.  Neuro: Alert and oriented x3, extra-ocular muscles intact, sensation grossly intact.  HEENT: Normocephalic, atraumatic, pupils equal round reactive to light, neck supple, no masses, no lymphadenopathy, thyroid nonpalpable.  Skin: Warm and dry, no rashes. Cardiac: Regular rate and rhythm, no murmurs rubs or gallops, no lower extremity edema.  Respiratory: Clear to auscultation bilaterally. Not using accessory muscles, speaking in full sentences.  Impression and Recommendations:

## 2015-05-19 NOTE — Telephone Encounter (Signed)
Patient called stated that he has missed doses of his Testosterone. Please advise that patient has appt today @ 1:30 to discuss his labs and to pick up another script for Testosterone. Rhonda Cunningham,CMA

## 2015-05-19 NOTE — Assessment & Plan Note (Signed)
Stable, no changes needed. 

## 2015-05-19 NOTE — Assessment & Plan Note (Signed)
Stable, has not needed Flomax in some time.

## 2015-05-19 NOTE — Assessment & Plan Note (Addendum)
Refilling testosterone. He will get his first shot today. Did get a bit of mild polycythemia on testosterone but not past 18-1/2, we can continue his testosterone.

## 2015-07-07 ENCOUNTER — Telehealth: Payer: Self-pay | Admitting: Sports Medicine

## 2015-07-07 NOTE — Telephone Encounter (Signed)
Refill request not appropriate, refill requested too soon.

## 2015-07-07 NOTE — Telephone Encounter (Signed)
Patient's girlfriend walked in and adv that pt request to know if he can get Testosterone script called into pharmacy at [facility] on [address]. Thanks

## 2015-09-13 ENCOUNTER — Telehealth: Payer: Self-pay | Admitting: Sports Medicine

## 2015-09-13 DIAGNOSIS — E291 Testicular hypofunction: Secondary | ICD-10-CM

## 2015-09-13 MED ORDER — TESTOSTERONE CYPIONATE 200 MG/ML IM SOLN: 300.0000 mg | INTRAMUSCULAR | Status: DC

## 2015-09-13 NOTE — Telephone Encounter (Signed)
Patient misplaced his rx for testeosterone and would please ask you to send another over the [facility] in [place] please.  Thank you!

## 2015-09-13 NOTE — Addendum Note (Signed)
Addended by: Monica BectonHEKKEKANDAM,  J on: 09/13/2015 05:21 PM   Modules accepted: Orders

## 2015-09-13 NOTE — Telephone Encounter (Addendum)
rx in box. No further refills for lost controlled substances.

## 2016-01-16 ENCOUNTER — Ambulatory Visit (INDEPENDENT_AMBULATORY_CARE_PROVIDER_SITE_OTHER): Payer: BLUE CROSS/BLUE SHIELD | Admitting: Family Medicine

## 2016-01-16 ENCOUNTER — Encounter: Payer: Self-pay | Admitting: Family Medicine

## 2016-01-16 VITALS — BP 144/95 | HR 98 | Temp 98.3°F | Ht 75.0 in | Wt 276.0 lb

## 2016-01-16 DIAGNOSIS — J329 Chronic sinusitis, unspecified: Secondary | ICD-10-CM | POA: Insufficient documentation

## 2016-01-16 DIAGNOSIS — J0100 Acute maxillary sinusitis, unspecified: Secondary | ICD-10-CM

## 2016-01-16 DIAGNOSIS — G4719 Other hypersomnia: Secondary | ICD-10-CM | POA: Insufficient documentation

## 2016-01-16 DIAGNOSIS — R0683 Snoring: Secondary | ICD-10-CM | POA: Diagnosis not present

## 2016-01-16 MED ORDER — IPRATROPIUM BROMIDE 0.06 % NA SOLN: 2.0000 | NASAL | Status: DC | PRN

## 2016-01-16 MED ORDER — AZITHROMYCIN 250 MG PO TABS: 250.0000 mg | ORAL_TABLET | Freq: Every day | ORAL | Status: DC

## 2016-01-16 MED ORDER — PREDNISONE 10 MG PO TABS: 30.0000 mg | ORAL_TABLET | Freq: Every day | ORAL | Status: DC

## 2016-01-16 NOTE — Progress Notes (Signed)
       Jose Jimenez is a 55 y.o. male who [facility] today for discuss sinus pain and pressure and possible sleep apnea.  Patient notes a 10 day history of left-sided sinus pain and pressure with file sinus odor discharge. No fevers or chills. Patient does note as though he is has generalized malaise and is not feeling well. He's tried some over-the-counter medicines which have helped a bit.  Additionally patient's concern he may have sleep apnea. He notes he feels tired all the time and notes that he snores and occasionally has apneic episodes. He's never been tested for sleep apnea.   No past medical history on file. Past Surgical History  Procedure Laterality Date  . Appendectomy     Social History  Substance Use Topics  . Smoking status: Former Games developermoker  . Smokeless tobacco: Not on file  . Alcohol Use: No   family history is not on file.  ROS as above Medications: Current Outpatient Prescriptions  Medication Sig Dispense Refill  . AMBULATORY NON FORMULARY MEDICATION #12 18g and #12 22g needles. #12 3mL syringes. For home testosterone supplementation. 1 each 11  . Multiple Vitamin (MULTIVITAMIN) tablet Take 1 tablet by mouth daily.    Marland Kitchen. testosterone cypionate (DEPOTESTOSTERONE CYPIONATE) 200 MG/ML injection Inject 1.5 mLs (300 mg total) into the muscle every 14 (fourteen) days. 30 mL 0  . azithromycin (ZITHROMAX) 250 MG tablet Take 1 tablet (250 mg total) by mouth daily. Take first 2 tablets together, then 1 every day until finished. 6 tablet 0  . ipratropium (ATROVENT) 0.06 % nasal spray Place 2 sprays into both nostrils every 4 (four) hours as needed for rhinitis. 10 mL 6  . predniSONE (DELTASONE) 10 MG tablet Take 3 tablets (30 mg total) by mouth daily with breakfast. 15 tablet 0   No current facility-administered medications for this visit.   No Known  Allergies   Exam:  BP 144/95 mmHg  Pulse 98  Temp(Src) 98.3 F (36.8 C) (Oral)  Ht 6\' 3"  (1.905 m)  Wt 276 lb (125.193 kg)  BMI 34.50 kg/m2  SpO2 96% Gen: Well NAD HEENT: EOMI,  MMM Large neck. Clear nasal discharge. Mildly tender to palpation left maxillary sinus. Normal tympanic membranes bilaterally. Normal posterior pharynx Lungs: Normal work of breathing. CTABL Heart: RRR no MRG Abd: NABS, Soft. Nondistended, Nontender Exts: Brisk capillary refill, warm and well perfused.   STOP BANG: Snore:    Y Tired:     Y Observed stop breathing:  Y Hypertension:   Y  BMI >35:   N Age >50:   Y Neck > 16 inches:  Y Male gender:   Y ------------------------------------------ Total:     7/8   No results found for this or any previous visit (from the past 24 hour(s)). No results found.   Please see individual assessment and plan sections.

## 2016-01-16 NOTE — Assessment & Plan Note (Signed)
Concern for sleep apnea. Home study ordered. StopBang 7/8

## 2016-01-16 NOTE — Assessment & Plan Note (Signed)
Symptoms consistent with sinusitis with possible early second sickening. Trial of over-the-counter medications including Atrovent nasal spray. If not improved will or if worsening will use prednisone and azithromycin.

## 2016-01-16 NOTE — Patient Instructions (Signed)
Thank you for coming in today. Take tylenol and use muccinex.  Use the nasal spray.  If not better take prednisone and use azithromycin antibiotics.  Return if not better.  Get sleep study soon.  Follow up with Dr. TKarie Schwalbe

## 2016-02-20 ENCOUNTER — Telehealth: Payer: Self-pay | Admitting: Sports Medicine

## 2016-02-20 DIAGNOSIS — E291 Testicular hypofunction: Secondary | ICD-10-CM

## 2016-02-20 MED ORDER — TESTOSTERONE CYPIONATE 200 MG/ML IM SOLN: 300.0000 mg | INTRAMUSCULAR | Status: DC

## 2016-02-20 NOTE — Telephone Encounter (Signed)
Pt called clinic requesting refill on Testosterone, based on last labs will route to PCP incase labs need to be redone prior to refill. Will route.

## 2016-02-20 NOTE — Telephone Encounter (Signed)
Okay to refill,prescription is in box, he will need to do couple of shots and then recheck blood work. Orders placed for blood work.

## 2016-02-21 NOTE — Telephone Encounter (Signed)
Left information on Pt's VM. Callback provided for any questions.  

## 2016-03-20 LAB — CBC
HCT: 46.4 % (ref 38.5–50.0)
Hemoglobin: 15.9 g/dL (ref 13.2–17.1)
MCH: 31.6 pg (ref 27.0–33.0)
MCHC: 34.3 g/dL (ref 32.0–36.0)
MCV: 92.2 fL (ref 80.0–100.0)
MPV: 10.4 fL (ref 7.5–12.5)
Platelets: 278 10*3/uL (ref 140–400)
RBC: 5.03 MIL/uL (ref 4.20–5.80)
RDW: 14.3 % (ref 11.0–15.0)
WBC: 9.1 10*3/uL (ref 3.8–10.8)

## 2016-03-20 LAB — TESTOSTERONE: Testosterone: 535 ng/dL (ref 250–827)

## 2016-03-20 LAB — PSA, TOTAL AND FREE
PSA, Free Pct: 18 % — ABNORMAL LOW (ref >25)
PSA, Free: 0.53 ng/mL
PSA: 2.97 ng/mL (ref <=4.00)

## 2016-05-15 ENCOUNTER — Ambulatory Visit (INDEPENDENT_AMBULATORY_CARE_PROVIDER_SITE_OTHER): Payer: BLUE CROSS/BLUE SHIELD | Admitting: Sports Medicine

## 2016-05-15 ENCOUNTER — Encounter: Payer: Self-pay | Admitting: Sports Medicine

## 2016-05-15 DIAGNOSIS — E669 Obesity, unspecified: Secondary | ICD-10-CM | POA: Diagnosis not present

## 2016-05-15 DIAGNOSIS — Z299 Encounter for prophylactic measures, unspecified: Secondary | ICD-10-CM

## 2016-05-15 DIAGNOSIS — R600 Localized edema: Secondary | ICD-10-CM

## 2016-05-15 DIAGNOSIS — J0100 Acute maxillary sinusitis, unspecified: Secondary | ICD-10-CM

## 2016-05-15 DIAGNOSIS — E785 Hyperlipidemia, unspecified: Secondary | ICD-10-CM

## 2016-05-15 DIAGNOSIS — E291 Testicular hypofunction: Secondary | ICD-10-CM | POA: Diagnosis not present

## 2016-05-15 LAB — HEMOGLOBIN A1C
Hgb A1c MFr Bld: 5.9 % — ABNORMAL HIGH (ref <5.7)
Mean Plasma Glucose: 123 mg/dL

## 2016-05-15 LAB — TSH: TSH: 1.27 m[IU]/L (ref 0.40–4.50)

## 2016-05-15 LAB — CBC
HCT: 47.9 % (ref 38.5–50.0)
Hemoglobin: 16.6 g/dL (ref 13.2–17.1)
MCH: 32.1 pg (ref 27.0–33.0)
MCHC: 34.7 g/dL (ref 32.0–36.0)
MCV: 92.6 fL (ref 80.0–100.0)
MPV: 10.8 fL (ref 7.5–12.5)
Platelets: 231 10*3/uL (ref 140–400)
RBC: 5.17 MIL/uL (ref 4.20–5.80)
RDW: 14.5 % (ref 11.0–15.0)
WBC: 8.1 K/uL (ref 3.8–10.8)

## 2016-05-15 MED ORDER — NALTREXONE-BUPROPION HCL ER 8-90 MG PO TB12: ORAL_TABLET | ORAL | 0 refills | Status: DC

## 2016-05-15 MED ORDER — TESTOSTERONE CYPIONATE 200 MG/ML IM SOLN
300.0000 mg | Freq: Once | INTRAMUSCULAR | Status: AC
  Administered 2016-05-15: 300 mg via INTRAMUSCULAR

## 2016-05-15 MED ORDER — PANTOPRAZOLE SODIUM 40 MG PO TBEC: 40.0000 mg | DELAYED_RELEASE_TABLET | Freq: Every day | ORAL | 3 refills | Status: DC

## 2016-05-15 MED ORDER — FLUTICASONE PROPIONATE 50 MCG/ACT NA SUSP: NASAL | 3 refills | Status: DC

## 2016-05-15 NOTE — Progress Notes (Signed)
  Subjective:    CC: Follow-up  HPI: Hyperlipidemia: Due to recheck  Hypogonadism: Needs a shot today.  Abnormal weight gain: Restart medication.  Congestion: Occurs with waking up and at night, combined with some throat clearing. No orthopnea, PND. Mild lower extremity swelling.  Past medical history, Surgical history, Family history not pertinant except as noted below, Social history, Allergies, and medications have been entered into the medical record, reviewed, and no changes needed.   Review of Systems: No fevers, chills, night sweats, weight loss, chest pain, or shortness of breath.   Objective:    General: Well Developed, well nourished, and in no acute distress.  Neuro: Alert and oriented x3, extra-ocular muscles intact, sensation grossly intact.  HEENT: Normocephalic, atraumatic, pupils equal round reactive to light, neck supple, no masses, no lymphadenopathy, thyroid nonpalpable.  Skin: Warm and dry, no rashes. Cardiac: Regular rate and rhythm, no murmurs rubs or gallops, Mild bilateral pitting lower extremity edema.  Respiratory: Clear to auscultation bilaterally. Not using accessory muscles, speaking in full sentences.  Impression and Recommendations:    Obesity Starting Contrave.  Male hypogonadism Levels have been well-controlled, giving the injection today, CBC and PSA has also been normal.  Hyperlipidemia Rechecking lipids.  Sinusitis With chronic congestion in the mornings, this is likely a combination of acid reflux and allergic rhinitis. Adding pantoprazole and Flonase.  Bilateral lower extremity edema No symptoms of heart failure other than lower extremity edema, checking a BNP.

## 2016-05-15 NOTE — Addendum Note (Signed)
Addended by: Baird Kay on: 05/15/2016 11:28 AM   Modules accepted: Orders

## 2016-05-15 NOTE — Assessment & Plan Note (Signed)
With chronic congestion in the mornings, this is likely a combination of acid reflux and allergic rhinitis. Adding pantoprazole and Flonase.

## 2016-05-15 NOTE — Assessment & Plan Note (Signed)
No symptoms of heart failure other than lower extremity edema, checking a BNP.

## 2016-05-15 NOTE — Assessment & Plan Note (Signed)
Levels have been well-controlled, giving the injection today, CBC and PSA has also been normal.

## 2016-05-15 NOTE — Assessment & Plan Note (Signed)
Rechecking lipids. 

## 2016-05-15 NOTE — Assessment & Plan Note (Addendum)
Starting Contrave 

## 2016-05-16 LAB — COMPREHENSIVE METABOLIC PANEL WITH GFR
AST: 16 U/L (ref 10–35)
CO2: 26 mmol/L (ref 20–31)
Creat: 1.01 mg/dL (ref 0.70–1.33)
Glucose, Bld: 94 mg/dL (ref 65–99)
Sodium: 137 mmol/L (ref 135–146)
Total Bilirubin: 0.8 mg/dL (ref 0.2–1.2)

## 2016-05-16 LAB — BRAIN NATRIURETIC PEPTIDE: Brain Natriuretic Peptide: 19.4 pg/mL (ref <100)

## 2016-05-16 LAB — HIV ANTIBODY (ROUTINE TESTING W REFLEX): HIV 1&2 Ab, 4th Generation: NONREACTIVE

## 2016-05-16 LAB — COMPREHENSIVE METABOLIC PANEL
ALT: 18 U/L (ref 9–46)
Albumin: 4 g/dL (ref 3.6–5.1)
Alkaline Phosphatase: 56 U/L (ref 40–115)
BUN: 11 mg/dL (ref 7–25)
Calcium: 9 mg/dL (ref 8.6–10.3)
Chloride: 103 mmol/L (ref 98–110)
Potassium: 4.5 mmol/L (ref 3.5–5.3)
Total Protein: 6.4 g/dL (ref 6.1–8.1)

## 2016-05-16 LAB — HEPATITIS C ANTIBODY: HCV Ab: NEGATIVE

## 2016-05-16 LAB — LIPID PANEL
Cholesterol: 171 mg/dL (ref 125–200)
HDL: 28 mg/dL — ABNORMAL LOW (ref >=40)
LDL Cholesterol: 108 mg/dL (ref <130)
Total CHOL/HDL Ratio: 6.1 Ratio — ABNORMAL HIGH (ref <=5.0)
Triglycerides: 173 mg/dL — ABNORMAL HIGH (ref <150)
VLDL: 35 mg/dL — ABNORMAL HIGH (ref <30)

## 2016-05-18 ENCOUNTER — Ambulatory Visit: Payer: BLUE CROSS/BLUE SHIELD | Admitting: Sports Medicine

## 2016-06-12 ENCOUNTER — Ambulatory Visit (INDEPENDENT_AMBULATORY_CARE_PROVIDER_SITE_OTHER): Payer: BLUE CROSS/BLUE SHIELD | Admitting: Sports Medicine

## 2016-06-12 ENCOUNTER — Encounter: Payer: Self-pay | Admitting: Sports Medicine

## 2016-06-12 DIAGNOSIS — E669 Obesity, unspecified: Secondary | ICD-10-CM | POA: Diagnosis not present

## 2016-06-12 MED ORDER — NALTREXONE-BUPROPION HCL ER 8-90 MG PO TB12: 2.0000 | ORAL_TABLET | Freq: Two times a day (BID) | ORAL | 11 refills | Status: DC

## 2016-06-12 NOTE — Assessment & Plan Note (Addendum)
Good weight loss on Contrave, refilling medication. He did get a speeding ticket, we will discuss this at a future visit.

## 2016-06-12 NOTE — Progress Notes (Signed)
  Subjective:    CC: Follow-up  HPI: Obesity: Good initial weight loss with Contrave.  Past medical history, Surgical history, Family history not pertinant except as noted below, Social history, Allergies, and medications have been entered into the medical record, reviewed, and no changes needed.   Review of Systems: No fevers, chills, night sweats, weight loss, chest pain, or shortness of breath.   Objective:    General: Well Developed, well nourished, and in no acute distress.  Neuro: Alert and oriented x3, extra-ocular muscles intact, sensation grossly intact.  HEENT: Normocephalic, atraumatic, pupils equal round reactive to light, neck supple, no masses, no lymphadenopathy, thyroid nonpalpable.  Skin: Warm and dry, no rashes. Cardiac: Regular rate and rhythm, no murmurs rubs or gallops, no lower extremity edema.  Respiratory: Clear to auscultation bilaterally. Not using accessory muscles, speaking in full sentences.  Impression and Recommendations:    Obesity Good weight loss on Contrave, refilling medication.

## 2016-07-10 ENCOUNTER — Encounter: Payer: Self-pay | Admitting: Sports Medicine

## 2016-07-10 ENCOUNTER — Ambulatory Visit (INDEPENDENT_AMBULATORY_CARE_PROVIDER_SITE_OTHER): Payer: BLUE CROSS/BLUE SHIELD | Admitting: Sports Medicine

## 2016-07-10 DIAGNOSIS — E785 Hyperlipidemia, unspecified: Secondary | ICD-10-CM | POA: Diagnosis not present

## 2016-07-10 DIAGNOSIS — E669 Obesity, unspecified: Secondary | ICD-10-CM

## 2016-07-10 DIAGNOSIS — Z299 Encounter for prophylactic measures, unspecified: Secondary | ICD-10-CM

## 2016-07-10 NOTE — Assessment & Plan Note (Signed)
Annual physical exam as above.  Declines influenza vaccination.

## 2016-07-10 NOTE — Assessment & Plan Note (Signed)
With mild hypertriglyceridemia which will improve as he loses weight. We can recheck in about 3 months.

## 2016-07-10 NOTE — Progress Notes (Signed)
  Subjective:    CC: Annual physical exam  HPI:  This is a pleasant 55 year old male, he has no complaints, here for his physical.  Hyperlipidemia: Mild. Currently losing weight.  Obesity: Good continued weight loss after 3 months of Contrave.  Past medical history:  Negative.  See flowsheet/record as well for more information.  Surgical history: Negative.  See flowsheet/record as well for more information.  Family history: Negative.  See flowsheet/record as well for more information.  Social history: Negative.  See flowsheet/record as well for more information.  Allergies, and medications have been entered into the medical record, reviewed, and no changes needed.    Review of Systems: No headache, visual changes, nausea, vomiting, diarrhea, constipation, dizziness, abdominal pain, skin rash, fevers, chills, night sweats, swollen lymph nodes, weight loss, chest pain, body aches, joint swelling, muscle aches, shortness of breath, mood changes, visual or auditory hallucinations.  Objective:    General: Well Developed, well nourished, and in no acute distress.  Neuro: Alert and oriented x3, extra-ocular muscles intact, sensation grossly intact. Cranial nerves II through XII are intact, motor, sensory, and coordinative functions are all intact. HEENT: Normocephalic, atraumatic, pupils equal round reactive to light, neck supple, no masses, no lymphadenopathy, thyroid nonpalpable. Oropharynx, nasopharynx, external ear canals are unremarkable. Skin: Warm and dry, no rashes noted.  Cardiac: Regular rate and rhythm, no murmurs rubs or gallops.  Respiratory: Clear to auscultation bilaterally. Not using accessory muscles, speaking in full sentences.  Abdominal: Soft, nontender, nondistended, positive bowel sounds, no masses, no organomegaly.  Musculoskeletal: Shoulder, elbow, wrist, hip, knee, ankle stable, and with full range of motion.  Impression and Recommendations:    The patient was  counselled, risk factors were discussed, anticipatory guidance given.  Preventive measure Annual physical exam as above.  Declines influenza vaccination.  Obesity Good continued weight loss, 5 pounds since last month.  Hyperlipidemia With mild hypertriglyceridemia which will improve as he loses weight. We can recheck in about 3 months.

## 2016-07-10 NOTE — Assessment & Plan Note (Signed)
Good continued weight loss, 5 pounds since last month.

## 2016-10-09 ENCOUNTER — Ambulatory Visit (INDEPENDENT_AMBULATORY_CARE_PROVIDER_SITE_OTHER): Payer: BLUE CROSS/BLUE SHIELD | Admitting: Sports Medicine

## 2016-10-09 ENCOUNTER — Encounter: Payer: Self-pay | Admitting: Sports Medicine

## 2016-10-09 DIAGNOSIS — Z299 Encounter for prophylactic measures, unspecified: Secondary | ICD-10-CM

## 2016-10-09 DIAGNOSIS — E291 Testicular hypofunction: Secondary | ICD-10-CM

## 2016-10-09 MED ORDER — TESTOSTERONE CYPIONATE 200 MG/ML IM SOLN: 300.0000 mg | INTRAMUSCULAR | 0 refills | Status: DC

## 2016-10-09 NOTE — Assessment & Plan Note (Signed)
Paperwork filled out today, for work.

## 2016-10-09 NOTE — Progress Notes (Signed)
  Subjective:    CC: Follow-up  HPI: Preventive measures: Needs paperwork filled out for work.  Hypogonadism: Doing well, needs a refill of testosterone.  Past medical history:  Negative.  See flowsheet/record as well for more information.  Surgical history: Negative.  See flowsheet/record as well for more information.  Family history: Negative.  See flowsheet/record as well for more information.  Social history: Negative.  See flowsheet/record as well for more information.  Allergies, and medications have been entered into the medical record, reviewed, and no changes needed.   Review of Systems: No fevers, chills, night sweats, weight loss, chest pain, or shortness of breath.   Objective:    General: Well Developed, well nourished, and in no acute distress.  Neuro: Alert and oriented x3, extra-ocular muscles intact, sensation grossly intact.  HEENT: Normocephalic, atraumatic, pupils equal round reactive to light, neck supple, no masses, no lymphadenopathy, thyroid nonpalpable.  Skin: Warm and dry, no rashes. Cardiac: Regular rate and rhythm, no murmurs rubs or gallops, no lower extremity edema.  Respiratory: Clear to auscultation bilaterally. Not using accessory muscles, speaking in full sentences.  Impression and Recommendations:    Preventive measure Paperwork filled out today, for work.  Male hypogonadism Refilling testosterone, in the next couple of months I would like him to recheck his testosterone, CBC, PSA.

## 2016-10-09 NOTE — Assessment & Plan Note (Signed)
Refilling testosterone, in the next couple of months I would like him to recheck his testosterone, CBC, PSA.

## 2017-03-05 ENCOUNTER — Ambulatory Visit (INDEPENDENT_AMBULATORY_CARE_PROVIDER_SITE_OTHER): Payer: BLUE CROSS/BLUE SHIELD | Admitting: Sports Medicine

## 2017-03-05 DIAGNOSIS — R109 Unspecified abdominal pain: Secondary | ICD-10-CM

## 2017-03-05 DIAGNOSIS — R10A2 Flank pain, left side: Secondary | ICD-10-CM | POA: Insufficient documentation

## 2017-03-05 LAB — COMPREHENSIVE METABOLIC PANEL
ALT: 20 U/L (ref 9–46)
AST: 16 U/L (ref 10–35)
Alkaline Phosphatase: 67 U/L (ref 40–115)
BUN: 18 mg/dL (ref 7–25)
CO2: 23 mmol/L (ref 20–31)
Glucose, Bld: 97 mg/dL (ref 65–99)
Sodium: 139 mmol/L (ref 135–146)
Total Bilirubin: 0.9 mg/dL (ref 0.2–1.2)

## 2017-03-05 LAB — POCT URINALYSIS DIPSTICK
Bilirubin, UA: NEGATIVE
Blood, UA: NEGATIVE
Glucose, UA: NEGATIVE
Leukocytes, UA: NEGATIVE
Nitrite, UA: NEGATIVE
Protein, UA: 30
Spec Grav, UA: 1.025 (ref 1.010–1.025)
Urobilinogen, UA: 1 U/dL
pH, UA: 5.5 (ref 5.0–8.0)

## 2017-03-05 LAB — CBC WITH DIFFERENTIAL/PLATELET
Basophils Absolute: 0 {cells}/uL (ref 0–200)
Basophils Relative: 0 %
Eosinophils Absolute: 104 {cells}/uL (ref 15–500)
Eosinophils Relative: 1 %
HCT: 46.9 % (ref 38.5–50.0)
Hemoglobin: 16.6 g/dL (ref 13.2–17.1)
Lymphocytes Relative: 20 %
Lymphs Abs: 2080 {cells}/uL (ref 850–3900)
MCH: 32.6 pg (ref 27.0–33.0)
MCHC: 35.4 g/dL (ref 32.0–36.0)
MCV: 92.1 fL (ref 80.0–100.0)
MPV: 10.4 fL (ref 7.5–12.5)
Monocytes Absolute: 936 cells/uL (ref 200–950)
Monocytes Relative: 9 %
Neutro Abs: 7280 {cells}/uL (ref 1500–7800)
Neutrophils Relative %: 70 %
Platelets: 251 10*3/uL (ref 140–400)
RBC: 5.09 MIL/uL (ref 4.20–5.80)
RDW: 14 % (ref 11.0–15.0)
WBC: 10.4 10*3/uL (ref 3.8–10.8)

## 2017-03-05 LAB — COMPREHENSIVE METABOLIC PANEL WITH GFR
Albumin: 4.4 g/dL (ref 3.6–5.1)
Calcium: 9.6 mg/dL (ref 8.6–10.3)
Chloride: 104 mmol/L (ref 98–110)
Creat: 1.18 mg/dL (ref 0.70–1.33)
Potassium: 4.1 mmol/L (ref 3.5–5.3)
Total Protein: 6.9 g/dL (ref 6.1–8.1)

## 2017-03-05 LAB — LIPASE: Lipase: 21 U/L (ref 7–60)

## 2017-03-05 LAB — AMYLASE: Amylase: 30 U/L (ref 21–101)

## 2017-03-05 NOTE — Assessment & Plan Note (Signed)
CBC, CMP, urinalysis. He is somewhat dehydrated. CT of the abdomen and pelvis with oral and IV contrast.

## 2017-03-05 NOTE — Progress Notes (Signed)
  Subjective:    CC: Acute abdominal pain  HPI: For the past several weeks this pleasant 56 year old male has had left lower quadrant and left flank abdominal pain, dizziness, presyncope, fatigue. Symptoms are moderate, persistent, no nausea, vomiting, diarrhea, constipation, no fevers or chills. No overt hematuria.  Past medical history:  Negative.  See flowsheet/record as well for more information.  Surgical history: Negative.  See flowsheet/record as well for more information.  Family history: Negative.  See flowsheet/record as well for more information.  Social history: Negative.  See flowsheet/record as well for more information.  Allergies, and medications have been entered into the medical record, reviewed, and no changes needed.   Review of Systems: No fevers, chills, night sweats, weight loss, chest pain, or shortness of breath.   Objective:    General: Well Developed, well nourished, and in no acute distress.  Neuro: Alert and oriented x3, extra-ocular muscles intact, sensation grossly intact.  HEENT: Normocephalic, atraumatic, pupils equal round reactive to light, neck supple, no masses, no lymphadenopathy, thyroid nonpalpable.  Skin: Warm and dry, no rashes. Cardiac: Regular rate and rhythm, no murmurs rubs or gallops, no lower extremity edema.  Respiratory: Clear to auscultation bilaterally. Not using accessory muscles, speaking in full sentences. Abdomen:  soft, tender to palpation the left lower quadrant, nondistended, normal bowel sounds, no palpable masses, no guarding, rigidity, rebound tenderness, no costovertebral angle pain.  Urinalysis is positive for ketones and protein.  Impression and Recommendations:    Acute abdominal pain in left flank CBC, CMP, urinalysis. He is somewhat dehydrated. CT of the abdomen and pelvis with oral and IV contrast.  I spent 25 minutes with this patient, greater than 50% was face-to-face time counseling regarding the above  diagnoses

## 2017-03-07 ENCOUNTER — Ambulatory Visit (INDEPENDENT_AMBULATORY_CARE_PROVIDER_SITE_OTHER): Payer: BLUE CROSS/BLUE SHIELD

## 2017-03-07 DIAGNOSIS — R109 Unspecified abdominal pain: Secondary | ICD-10-CM

## 2017-03-07 DIAGNOSIS — I700 Atherosclerosis of aorta: Secondary | ICD-10-CM | POA: Diagnosis not present

## 2017-03-07 DIAGNOSIS — N400 Enlarged prostate without lower urinary tract symptoms: Secondary | ICD-10-CM

## 2017-03-07 MED ORDER — IOPAMIDOL (ISOVUE-300) INJECTION 61%
100.0000 mL | Freq: Once | INTRAVENOUS | Status: AC | PRN
  Administered 2017-03-07: 100 mL via INTRAVENOUS

## 2017-11-14 ENCOUNTER — Ambulatory Visit (INDEPENDENT_AMBULATORY_CARE_PROVIDER_SITE_OTHER): Payer: BLUE CROSS/BLUE SHIELD

## 2017-11-14 ENCOUNTER — Ambulatory Visit (INDEPENDENT_AMBULATORY_CARE_PROVIDER_SITE_OTHER): Payer: BLUE CROSS/BLUE SHIELD | Admitting: Sports Medicine

## 2017-11-14 DIAGNOSIS — G4719 Other hypersomnia: Secondary | ICD-10-CM

## 2017-11-14 DIAGNOSIS — E291 Testicular hypofunction: Secondary | ICD-10-CM

## 2017-11-14 DIAGNOSIS — M1712 Unilateral primary osteoarthritis, left knee: Secondary | ICD-10-CM | POA: Diagnosis not present

## 2017-11-14 DIAGNOSIS — N139 Obstructive and reflux uropathy, unspecified: Secondary | ICD-10-CM | POA: Diagnosis not present

## 2017-11-14 DIAGNOSIS — M170 Bilateral primary osteoarthritis of knee: Secondary | ICD-10-CM | POA: Diagnosis not present

## 2017-11-14 DIAGNOSIS — M2342 Loose body in knee, left knee: Secondary | ICD-10-CM | POA: Diagnosis not present

## 2017-11-14 MED ORDER — TESTOSTERONE CYPIONATE 200 MG/ML IM SOLN: 300.0000 mg | INTRAMUSCULAR | 0 refills | Status: DC

## 2017-11-14 MED ORDER — CELECOXIB 200 MG PO CAPS
ORAL_CAPSULE | ORAL | 2 refills | Status: DC
Start: 2017-11-14 — End: 2019-01-14

## 2017-11-14 MED ORDER — TAMSULOSIN HCL 0.4 MG PO CAPS
0.4000 mg | ORAL_CAPSULE | Freq: Every day | ORAL | 3 refills | Status: DC
Start: 2017-11-14 — End: 2019-01-14

## 2017-11-14 NOTE — Assessment & Plan Note (Signed)
X-rays, reaction knee brace. Celebrex. Rehab exercises given. Return to see me in 1 month, injection if no better.

## 2017-11-14 NOTE — Progress Notes (Signed)
  Subjective:    CC: Several issues  HPI: Left knee pain: With swelling, gelling, moderate, persistent, no trauma.  Localized at the medial joint line.  Would like to try more conservative approach to start out with.  Excessive daytime sleepiness: Multifactorial, he does wake multiple times to urinate, we did prescribe Flomax in the past but he has not been taking it.  He also has significant snoring, a large neck, hypertension, is wondering if he may have some sleep apnea.  I reviewed the past medical history, family history, social history, surgical history, and allergies today and no changes were needed.  Please see the problem list section below in epic for further details.  Past Medical History: No past medical history on file. Past Surgical History: Past Surgical History:  Procedure Laterality Date  . APPENDECTOMY     Social History: Social History   Socioeconomic History  . Marital status: Married    Spouse name: Not on file  . Number of children: Not on file  . Years of education: Not on file  . Highest education level: Not on file  Social Needs  . Financial resource strain: Not on file  . Food insecurity - worry: Not on file  . Food insecurity - inability: Not on file  . Transportation needs - medical: Not on file  . Transportation needs - non-medical: Not on file  Occupational History  . Not on file  Tobacco Use  . Smoking status: Former Smoker  Substance and Sexual Activity  . Alcohol use: No  . Drug use: No  . Sexual activity: Yes  Other Topics Concern  . Not on file  Social History Narrative  . Not on file   Family History: No family history on file. Allergies: No Known Allergies Medications: See med rec.  Review of Systems: No fevers, chills, night sweats, weight loss, chest pain, or shortness of breath.   Objective:    General: Well Developed, well nourished, and in no acute distress.  Neuro: Alert and oriented x3, extra-ocular muscles intact,  sensation grossly intact.  HEENT: Normocephalic, atraumatic, pupils equal round reactive to light, neck supple, no masses, no lymphadenopathy, thyroid nonpalpable.  Skin: Warm and dry, no rashes. Cardiac: Regular rate and rhythm, no murmurs rubs or gallops, no lower extremity edema.  Respiratory: Clear to auscultation bilaterally. Not using accessory muscles, speaking in full sentences. Left knee: Visibly swollen, palpable fluid wave and effusion, tender to palpation at the medial joint line. ROM normal in flexion and extension and lower leg rotation. Ligaments with solid consistent endpoints including ACL, PCL, LCL, MCL. Negative Mcmurray's and provocative meniscal tests. Non painful patellar compression. Patellar and quadriceps tendons unremarkable. Hamstring and quadriceps strength is normal.   Impression and Recommendations:    Primary osteoarthritis of left knee X-rays, reaction knee brace. Celebrex. Rehab exercises given. Return to see me in 1 month, injection if no better.  Excessive daytime sleepiness Setting him up for a home sleep study. History of hypertension, large neck circumference, nonrestorative sleep, snoring.  Obstructive uropathy Multiple episodes of nocturia. Patient has not been consistent with his Flomax, he will restart this.  I spent 25 minutes with this patient, greater than 50% was face-to-face time counseling regarding the above diagnoses ___________________________________________ Ihor Austinhomas J. Benjamin Stainhekkekandam, M.D., ABFM., CAQSM. Primary Care and Sports Medicine [facility]  Adjunct Instructor of Family Medicine  [employer]

## 2017-11-14 NOTE — Assessment & Plan Note (Signed)
Setting him up for a home sleep study. History of hypertension, large neck circumference, nonrestorative sleep, snoring.

## 2017-11-14 NOTE — Assessment & Plan Note (Signed)
Multiple episodes of nocturia. Patient has not been consistent with his Flomax, he will restart this.

## 2017-11-15 ENCOUNTER — Telehealth: Payer: Self-pay | Admitting: Sports Medicine

## 2017-11-15 DIAGNOSIS — E291 Testicular hypofunction: Secondary | ICD-10-CM

## 2017-11-15 NOTE — Telephone Encounter (Signed)
Yes, new labs ordered, standing orders for 2 morning labs.

## 2017-11-15 NOTE — Telephone Encounter (Signed)
DR T.    I received a prior authorization for Testosterone for Jose Jimenez and the last lab he had was a year ago and his levels were in normal range so he will not be approved. Can we order new labs to see what they levels say now?  cindy

## 2017-11-15 NOTE — Telephone Encounter (Signed)
I called and left a voicemail for patient to call me so I could let him know he would have to have labs drawn for his medication. - CF

## 2017-12-12 ENCOUNTER — Ambulatory Visit: Payer: BLUE CROSS/BLUE SHIELD | Admitting: Sports Medicine

## 2017-12-12 DIAGNOSIS — Z0189 Encounter for other specified special examinations: Secondary | ICD-10-CM

## 2018-06-03 ENCOUNTER — Ambulatory Visit: Payer: BLUE CROSS/BLUE SHIELD | Admitting: Sports Medicine

## 2018-06-03 ENCOUNTER — Telehealth: Payer: Self-pay | Admitting: Sports Medicine

## 2018-06-03 NOTE — Telephone Encounter (Signed)
Pt called at [phone] to cancel. He is stuck out of town.

## 2019-01-14 ENCOUNTER — Other Ambulatory Visit: Payer: Self-pay

## 2019-01-14 ENCOUNTER — Ambulatory Visit (INDEPENDENT_AMBULATORY_CARE_PROVIDER_SITE_OTHER): Payer: Self-pay | Admitting: Sports Medicine

## 2019-01-14 ENCOUNTER — Ambulatory Visit (INDEPENDENT_AMBULATORY_CARE_PROVIDER_SITE_OTHER): Payer: Self-pay

## 2019-01-14 ENCOUNTER — Encounter: Payer: Self-pay | Admitting: Sports Medicine

## 2019-01-14 DIAGNOSIS — M5116 Intervertebral disc disorders with radiculopathy, lumbar region: Secondary | ICD-10-CM

## 2019-01-14 DIAGNOSIS — M5416 Radiculopathy, lumbar region: Secondary | ICD-10-CM

## 2019-01-14 DIAGNOSIS — E669 Obesity, unspecified: Secondary | ICD-10-CM

## 2019-01-14 MED ORDER — GABAPENTIN 300 MG PO CAPS: ORAL_CAPSULE | ORAL | 3 refills | Status: DC

## 2019-01-14 MED ORDER — PREDNISONE 50 MG PO TABS: ORAL_TABLET | ORAL | 0 refills | Status: DC

## 2019-01-14 NOTE — Assessment & Plan Note (Signed)
Prednisone, gabapentin. Updated x-rays. Home rehab exercises given, he is healthy. Return to see me in 6 weeks, MR for interventional planning if no better.

## 2019-01-14 NOTE — Progress Notes (Signed)
Subjective:    CC: Low back pain  HPI: For the past several weeks this pleasant 58 year old male has had on and off but severe pain in his low back with radiation down the right leg, to the outside of the right lower leg, with numbness and tingling in the bottom of both feet.  No bowel or bladder dysfunction, saddle numbness, constitutional symptoms.  I reviewed the past medical history, family history, social history, surgical history, and allergies today and no changes were needed.  Please see the problem list section below in epic for further details.  Past Medical History: No past medical history on file. Past Surgical History: Past Surgical History:  Procedure Laterality Date  . APPENDECTOMY     Social History: Social History   Socioeconomic History  . Marital status: Married    Spouse name: Not on file  . Number of children: Not on file  . Years of education: Not on file  . Highest education level: Not on file  Occupational History  . Not on file  Social Needs  . Financial resource strain: Not on file  . Food insecurity:    Worry: Not on file    Inability: Not on file  . Transportation needs:    Medical: Not on file    Non-medical: Not on file  Tobacco Use  . Smoking status: Former Games developer  . Smokeless tobacco: Never Used  Substance and Sexual Activity  . Alcohol use: No  . Drug use: No  . Sexual activity: Yes  Lifestyle  . Physical activity:    Days per week: Not on file    Minutes per session: Not on file  . Stress: Not on file  Relationships  . Social connections:    Talks on phone: Not on file    Gets together: Not on file    Attends religious service: Not on file    Active member of club or organization: Not on file    Attends meetings of clubs or organizations: Not on file    Relationship status: Not on file  Other Topics Concern  . Not on file  Social History Narrative  . Not on file   Family History: No family history on file. Allergies:  No Known Allergies Medications: See med rec.  Review of Systems: No fevers, chills, night sweats, weight loss, chest pain, or shortness of breath.   Objective:    General: Well Developed, well nourished, and in no acute distress.  Neuro: Alert and oriented x3, extra-ocular muscles intact, sensation grossly intact.  HEENT: Normocephalic, atraumatic, pupils equal round reactive to light, neck supple, no masses, no lymphadenopathy, thyroid nonpalpable.  Skin: Warm and dry, no rashes. Cardiac: Regular rate and rhythm, no murmurs rubs or gallops, no lower extremity edema.  Respiratory: Clear to auscultation bilaterally. Not using accessory muscles, speaking in full sentences. Back Exam:  Inspection: Unremarkable  Motion: Flexion 45 deg, Extension 45 deg, Side Bending to 45 deg bilaterally,  Rotation to 45 deg bilaterally  SLR laying: Negative  XSLR laying: Negative  Palpable tenderness: None. FABER: negative. Sensory change: Gross sensation intact to all lumbar and sacral dermatomes.  Reflexes: 2+ at both patellar tendons, 2+ at achilles tendons, Babinski's downgoing.  Strength at foot  Plantar-flexion: 5/5 Dorsi-flexion: 5/5 Eversion: 5/5 Inversion: 5/5  Leg strength  Quad: 5/5 Hamstring: 5/5 Hip flexor: 5/5 Hip abductors: 5/5  Gait unremarkable.  Impression and Recommendations:    Right lumbar radiculitis Prednisone, gabapentin. Updated x-rays. Home rehab exercises given,  he is healthy. Return to see me in 6 weeks, MR for interventional planning if no better.  Obesity Checking routine labs. He will continue to work on a low carbohydrate diet and lose a lot of weight.   ___________________________________________ Ihor Austin. Benjamin Stain, M.D., ABFM., CAQSM. Primary Care and Sports Medicine [facility]  Adjunct Professor of Family Medicine  [employer]

## 2019-01-14 NOTE — Assessment & Plan Note (Signed)
Checking routine labs. He will continue to work on a low carbohydrate diet and lose a lot of weight.

## 2019-01-16 LAB — COMPREHENSIVE METABOLIC PANEL
AG Ratio: 1.8 (calc) (ref 1.0–2.5)
ALT: 33 U/L (ref 9–46)
AST: 24 U/L (ref 10–35)
Albumin: 4.2 g/dL (ref 3.6–5.1)
Alkaline phosphatase (APISO): 74 U/L (ref 35–144)
BUN: 18 mg/dL (ref 7–25)
CO2: 25 mmol/L (ref 20–32)
Calcium: 9.3 mg/dL (ref 8.6–10.3)
Chloride: 106 mmol/L (ref 98–110)
Creat: 1.04 mg/dL (ref 0.70–1.33)
Globulin: 2.3 g/dL (calc) (ref 1.9–3.7)
Glucose, Bld: 112 mg/dL — ABNORMAL HIGH (ref 65–99)
Potassium: 4.1 mmol/L (ref 3.5–5.3)
Sodium: 140 mmol/L (ref 135–146)
Total Bilirubin: 0.6 mg/dL (ref 0.2–1.2)
Total Protein: 6.5 g/dL (ref 6.1–8.1)

## 2019-01-16 LAB — CBC
HCT: 42 % (ref 38.5–50.0)
Hemoglobin: 14.8 g/dL (ref 13.2–17.1)
MCH: 31.8 pg (ref 27.0–33.0)
MCHC: 35.2 g/dL (ref 32.0–36.0)
MCV: 90.1 fL (ref 80.0–100.0)
MPV: 10.9 fL (ref 7.5–12.5)
Platelets: 253 10*3/uL (ref 140–400)
RBC: 4.66 10*6/uL (ref 4.20–5.80)
RDW: 13.1 % (ref 11.0–15.0)
WBC: 10.1 10*3/uL (ref 3.8–10.8)

## 2019-01-16 LAB — TSH: TSH: 0.74 mIU/L (ref 0.40–4.50)

## 2019-01-16 LAB — LIPID PANEL W/REFLEX DIRECT LDL
Cholesterol: 235 mg/dL — ABNORMAL HIGH (ref <200)
HDL: 41 mg/dL (ref >=40)
LDL Cholesterol (Calc): 159 mg/dL (calc) — ABNORMAL HIGH
Non-HDL Cholesterol (Calc): 194 mg/dL (calc) — ABNORMAL HIGH (ref <130)
Total CHOL/HDL Ratio: 5.7 (calc) — ABNORMAL HIGH (ref <5.0)
Triglycerides: 190 mg/dL — ABNORMAL HIGH (ref <150)

## 2019-01-28 ENCOUNTER — Encounter: Payer: Self-pay | Admitting: Sports Medicine

## 2019-02-25 ENCOUNTER — Encounter: Payer: Self-pay | Admitting: Sports Medicine

## 2019-02-25 ENCOUNTER — Ambulatory Visit (INDEPENDENT_AMBULATORY_CARE_PROVIDER_SITE_OTHER): Payer: Self-pay | Admitting: Sports Medicine

## 2019-02-25 DIAGNOSIS — M5416 Radiculopathy, lumbar region: Secondary | ICD-10-CM

## 2019-02-25 DIAGNOSIS — Z299 Encounter for prophylactic measures, unspecified: Secondary | ICD-10-CM

## 2019-02-25 MED ORDER — DICLOFENAC SODIUM 75 MG PO TBEC: 75.0000 mg | DELAYED_RELEASE_TABLET | Freq: Two times a day (BID) | ORAL | 3 refills | Status: DC

## 2019-02-25 NOTE — Assessment & Plan Note (Signed)
Due for colon cancer screening, we can do Cologuard, he is still awaiting insurance to kick in.

## 2019-02-25 NOTE — Assessment & Plan Note (Signed)
Did extremely well with prednisone, gabapentin, he was able to get some diclofenac that seem to work very well. I am going to give him some Zipsor samples as well as a prescription for Voltaren 75 mg to use twice a day. Return to see me as needed. Do not stop the rehab exercises.

## 2019-02-25 NOTE — Progress Notes (Signed)
  Subjective:    CC: Follow-up  HPI: This is a pleasant 58 year old male, we treated him for right lumbar radiculopathy for last visit, he did extremely well with prednisone, gabapentin, he was able to find some diclofenac that seems to work very well, the rehab exercises and a TENS unit have also been tremendously efficacious.  I reviewed the past medical history, family history, social history, surgical history, and allergies today and no changes were needed.  Please see the problem list section below in epic for further details.  Past Medical History: No past medical history on file. Past Surgical History: Past Surgical History:  Procedure Laterality Date  . APPENDECTOMY     Social History: Social History   Socioeconomic History  . Marital status: Married    Spouse name: Not on file  . Number of children: Not on file  . Years of education: Not on file  . Highest education level: Not on file  Occupational History  . Not on file  Social Needs  . Financial resource strain: Not on file  . Food insecurity:    Worry: Not on file    Inability: Not on file  . Transportation needs:    Medical: Not on file    Non-medical: Not on file  Tobacco Use  . Smoking status: Former Games developer  . Smokeless tobacco: Never Used  Substance and Sexual Activity  . Alcohol use: No  . Drug use: No  . Sexual activity: Yes  Lifestyle  . Physical activity:    Days per week: Not on file    Minutes per session: Not on file  . Stress: Not on file  Relationships  . Social connections:    Talks on phone: Not on file    Gets together: Not on file    Attends religious service: Not on file    Active member of club or organization: Not on file    Attends meetings of clubs or organizations: Not on file    Relationship status: Not on file  Other Topics Concern  . Not on file  Social History Narrative  . Not on file   Family History: No family history on file. Allergies: No Known Allergies  Medications: See med rec.  Review of Systems: No fevers, chills, night sweats, weight loss, chest pain, or shortness of breath.   Objective:    General: Well Developed, well nourished, and in no acute distress.  Neuro: Alert and oriented x3, extra-ocular muscles intact, sensation grossly intact.  HEENT: Normocephalic, atraumatic, pupils equal round reactive to light, neck supple, no masses, no lymphadenopathy, thyroid nonpalpable.  Skin: Warm and dry, no rashes. Cardiac: Regular rate and rhythm, no murmurs rubs or gallops, no lower extremity edema.  Respiratory: Clear to auscultation bilaterally. Not using accessory muscles, speaking in full sentences.  Impression and Recommendations:    Right lumbar radiculitis Did extremely well with prednisone, gabapentin, he was able to get some diclofenac that seem to work very well. I am going to give him some Zipsor samples as well as a prescription for Voltaren 75 mg to use twice a day. Return to see me as needed. Do not stop the rehab exercises.  Preventive measure Due for colon cancer screening, we can do Cologuard, he is still awaiting insurance to kick in.   ___________________________________________ Ihor Austin. Benjamin Stain, M.D., ABFM., CAQSM. Primary Care and Sports Medicine [facility]  Adjunct Professor of Family Medicine  [employer]

## 2019-09-15 ENCOUNTER — Encounter: Payer: Self-pay | Admitting: Sports Medicine

## 2019-09-15 ENCOUNTER — Ambulatory Visit (INDEPENDENT_AMBULATORY_CARE_PROVIDER_SITE_OTHER): Payer: Managed Care, Other (non HMO) | Admitting: Sports Medicine

## 2019-09-15 DIAGNOSIS — M1712 Unilateral primary osteoarthritis, left knee: Secondary | ICD-10-CM | POA: Diagnosis not present

## 2019-09-15 DIAGNOSIS — G4726 Circadian rhythm sleep disorder, shift work type: Secondary | ICD-10-CM | POA: Diagnosis not present

## 2019-09-15 MED ORDER — ZOLPIDEM TARTRATE 5 MG PO TABS: 5.0000 mg | ORAL_TABLET | Freq: Every evening | ORAL | 0 refills | Status: DC | PRN

## 2019-09-15 MED ORDER — MELOXICAM 15 MG PO TABS: ORAL_TABLET | ORAL | 11 refills | Status: DC

## 2019-09-15 MED ORDER — MELATONIN 5 MG PO TABS: ORAL_TABLET | ORAL | 11 refills | Status: DC

## 2019-09-15 NOTE — Assessment & Plan Note (Signed)
Melatonin 5 mg daily approximately an hour before he plans to go to bed. Adding Ambien 5 mg, we can follow this up in 2 weeks and evaluate if we need to go up to 10 mg.

## 2019-09-15 NOTE — Progress Notes (Signed)
Virtual Visit via WebEx/MyChart   I connected with  Jose Jimenez  on 09/15/19 via WebEx/MyChart/Doximity Video and verified that I am speaking with the correct person using two identifiers.   I discussed the limitations, risks, security and privacy concerns of performing an evaluation and management service by WebEx/MyChart/Doximity Video, including the higher likelihood of inaccurate diagnosis and treatment, and the availability of in person appointments.  We also discussed the likely need of an additional face to face encounter for complete and high quality delivery of care.  I also discussed with the patient that there may be a patient responsible charge related to this service. The patient expressed understanding and wishes to proceed.  Provider location is either at home or medical facility. Patient location is at their home, different from provider location. People involved in care of the patient during this telehealth encounter were myself, my nurse/medical assistant, and my front office/scheduling team member.  Subjective:    CC: Issues with sleep  HPI: Jose Jimenez is a pleasant 58 year old male, he has started a job where he works 7 PM to 7 AM, not surprisingly he has significant difficulty getting to sleep, the combination of poor sleep latency, as well as poor quality of sleep, he is looking for help.  I reviewed the past medical history, family history, social history, surgical history, and allergies today and no changes were needed.  Please see the problem list section below in epic for further details.  Past Medical History: No past medical history on file. Past Surgical History: Past Surgical History:  Procedure Laterality Date  . APPENDECTOMY     Social History: Social History   Socioeconomic History  . Marital status: Married    Spouse name: Not on file  . Number of children: Not on file  . Years of education: Not on file  . Highest education level: Not on file   Occupational History  . Not on file  Social Needs  . Financial resource strain: Not on file  . Food insecurity    Worry: Not on file    Inability: Not on file  . Transportation needs    Medical: Not on file    Non-medical: Not on file  Tobacco Use  . Smoking status: Former Research scientist (life sciences)  . Smokeless tobacco: Never Used  Substance and Sexual Activity  . Alcohol use: No  . Drug use: No  . Sexual activity: Yes  Lifestyle  . Physical activity    Days per week: Not on file    Minutes per session: Not on file  . Stress: Not on file  Relationships  . Social Herbalist on phone: Not on file    Gets together: Not on file    Attends religious service: Not on file    Active member of club or organization: Not on file    Attends meetings of clubs or organizations: Not on file    Relationship status: Not on file  Other Topics Concern  . Not on file  Social History Narrative  . Not on file   Family History: No family history on file. Allergies: No Known Allergies Medications: See med rec.  Review of Systems: No fevers, chills, night sweats, weight loss, chest pain, or shortness of breath.   Objective:    General: Speaking full sentences, no audible heavy breathing.  Sounds alert and appropriately interactive.  Appears well.  Face symmetric.  Extraocular movements intact.  Pupils equal and round.  No nasal flaring or  accessory muscle use visualized.  No other physical exam performed due to the non-physical nature of this visit.  Impression and Recommendations:    Shift work sleep disorder Melatonin 5 mg daily approximately an hour before he plans to go to bed. Adding Ambien 5 mg, we can follow this up in 2 weeks and evaluate if we need to go up to 10 mg.  I discussed the above assessment and treatment plan with the patient. The patient was provided an opportunity to ask questions and all were answered. The patient agreed with the plan and demonstrated an understanding of the  instructions.   The patient was advised to call back or seek an in-person evaluation if the symptoms worsen or if the condition fails to improve as anticipated.   I provided 25 minutes of non-face-to-face time during this encounter, 15 minutes of additional time was needed to gather information, review chart, records, communicate/coordinate with staff remotely, troubleshooting the multiple errors that we get every time when trying to do video calls through the electronic medical record, WebEx, and Doximity, restart the encounter multiple times due to instability of the software, as well as complete documentation.   ___________________________________________ Ihor Austin. Benjamin Stain, M.D., ABFM., CAQSM. Primary Care and Sports Medicine [facility]  Adjunct Professor of Family Medicine  [employer]

## 2019-12-18 ENCOUNTER — Other Ambulatory Visit: Payer: Self-pay

## 2019-12-18 ENCOUNTER — Ambulatory Visit (INDEPENDENT_AMBULATORY_CARE_PROVIDER_SITE_OTHER): Payer: Managed Care, Other (non HMO) | Admitting: Sports Medicine

## 2019-12-18 DIAGNOSIS — B078 Other viral warts: Secondary | ICD-10-CM | POA: Diagnosis not present

## 2019-12-18 DIAGNOSIS — L603 Nail dystrophy: Secondary | ICD-10-CM

## 2019-12-18 DIAGNOSIS — B079 Viral wart, unspecified: Secondary | ICD-10-CM | POA: Insufficient documentation

## 2019-12-18 MED ORDER — TERBINAFINE HCL 250 MG PO TABS: 250.0000 mg | ORAL_TABLET | Freq: Every day | ORAL | 2 refills | Status: DC

## 2019-12-18 NOTE — Progress Notes (Signed)
    Procedures performed today:    Procedure:  Cryodestruction of #3 warts on the right hand Consent obtained and verified. Time-out conducted. Noted no overlying erythema, induration, or other signs of local infection. Completed without difficulty using Cryo-Gun. Advised to call if fevers/chills, erythema, induration, drainage, or persistent bleeding.  Independent interpretation of notes and tests performed by another provider:   None.  Impression and Recommendations:    Onychodystrophy likely secondary to onychomycosis Thick yellow crusty nail, present for months. Adding Lamisil, we will likely need to treat him for 6 to 9 months. We will check liver function in a month. If failure of treatment at 6 to 9 months we will simply remove the nail.  Verruca 3 warts on the right hand, aggressive cryotherapy today, return to see me if not fully gone in a month.    ___________________________________________ Ihor Austin. Benjamin Stain, M.D., ABFM., CAQSM. Primary Care and Sports Medicine [facility]  Adjunct Instructor of Family Medicine  [employer]

## 2019-12-18 NOTE — Assessment & Plan Note (Signed)
3 warts on the right hand, aggressive cryotherapy today, return to see me if not fully gone in a month.

## 2019-12-18 NOTE — Assessment & Plan Note (Signed)
Thick yellow crusty nail, present for months. Adding Lamisil, we will likely need to treat him for 6 to 9 months. We will check liver function in a month. If failure of treatment at 6 to 9 months we will simply remove the nail.

## 2020-01-18 ENCOUNTER — Other Ambulatory Visit: Payer: Self-pay | Admitting: Sports Medicine

## 2020-01-18 ENCOUNTER — Encounter: Payer: Self-pay | Admitting: Sports Medicine

## 2020-01-18 ENCOUNTER — Ambulatory Visit (INDEPENDENT_AMBULATORY_CARE_PROVIDER_SITE_OTHER): Payer: Managed Care, Other (non HMO) | Admitting: Sports Medicine

## 2020-01-18 ENCOUNTER — Other Ambulatory Visit: Payer: Self-pay

## 2020-01-18 DIAGNOSIS — L603 Nail dystrophy: Secondary | ICD-10-CM | POA: Diagnosis not present

## 2020-01-18 DIAGNOSIS — B078 Other viral warts: Secondary | ICD-10-CM | POA: Diagnosis not present

## 2020-01-18 DIAGNOSIS — B079 Viral wart, unspecified: Secondary | ICD-10-CM

## 2020-01-18 LAB — COMPLETE METABOLIC PANEL WITH GFR
AG Ratio: 1.7 (calc) (ref 1.0–2.5)
ALT: 17 U/L (ref 9–46)
AST: 12 U/L (ref 10–35)
Albumin: 4.1 g/dL (ref 3.6–5.1)
Alkaline phosphatase (APISO): 74 U/L (ref 35–144)
BUN: 13 mg/dL (ref 7–25)
CO2: 28 mmol/L (ref 20–32)
Calcium: 9.4 mg/dL (ref 8.6–10.3)
Chloride: 103 mmol/L (ref 98–110)
Creat: 0.98 mg/dL (ref 0.70–1.33)
GFR, Est African American: 98 mL/min/{1.73_m2} (ref >=60)
GFR, Est Non African American: 85 mL/min/{1.73_m2} (ref >=60)
Globulin: 2.4 g/dL (calc) (ref 1.9–3.7)
Glucose, Bld: 109 mg/dL — ABNORMAL HIGH (ref 65–99)
Potassium: 4.7 mmol/L (ref 3.5–5.3)
Sodium: 137 mmol/L (ref 135–146)
Total Bilirubin: 0.5 mg/dL (ref 0.2–1.2)
Total Protein: 6.5 g/dL (ref 6.1–8.1)

## 2020-01-18 NOTE — Progress Notes (Signed)
    Procedures performed today:    Procedure:  Cryodestruction of #3 warts on the right index finger Consent obtained and verified. Time-out conducted. Noted no overlying erythema, induration, or other signs of local infection. Completed without difficulty using Cryo-Gun. Advised to call if fevers/chills, erythema, induration, drainage, or persistent bleeding.  Independent interpretation of notes and tests performed by another provider:   None.  Brief History, Exam, Impression, and Recommendations:    Verruca Good improvements in the warts on his right index finger, we repeated cryotherapy today. Return as needed for this.    ___________________________________________ Jose Jimenez. Benjamin Stain, M.D., ABFM., CAQSM. Primary Care and Sports Medicine [facility]  Adjunct Instructor of Family Medicine  [employer]

## 2020-01-18 NOTE — Assessment & Plan Note (Signed)
Good improvements in the warts on his right index finger, we repeated cryotherapy today. Return as needed for this.

## 2020-01-26 ENCOUNTER — Encounter: Payer: Managed Care, Other (non HMO) | Admitting: Sports Medicine

## 2020-02-03 ENCOUNTER — Ambulatory Visit (INDEPENDENT_AMBULATORY_CARE_PROVIDER_SITE_OTHER): Payer: Managed Care, Other (non HMO) | Admitting: Sports Medicine

## 2020-02-03 ENCOUNTER — Encounter: Payer: Self-pay | Admitting: Sports Medicine

## 2020-02-03 ENCOUNTER — Other Ambulatory Visit: Payer: Self-pay

## 2020-02-03 VITALS — BP 120/76 | HR 67 | Ht 75.0 in | Wt 288.0 lb

## 2020-02-03 DIAGNOSIS — R0789 Other chest pain: Secondary | ICD-10-CM | POA: Diagnosis not present

## 2020-02-03 DIAGNOSIS — E785 Hyperlipidemia, unspecified: Secondary | ICD-10-CM | POA: Diagnosis not present

## 2020-02-03 DIAGNOSIS — R079 Chest pain, unspecified: Secondary | ICD-10-CM

## 2020-02-03 DIAGNOSIS — E291 Testicular hypofunction: Secondary | ICD-10-CM

## 2020-02-03 DIAGNOSIS — R600 Localized edema: Secondary | ICD-10-CM

## 2020-02-03 DIAGNOSIS — Z0000 Encounter for general adult medical examination without abnormal findings: Secondary | ICD-10-CM | POA: Diagnosis not present

## 2020-02-03 DIAGNOSIS — L603 Nail dystrophy: Secondary | ICD-10-CM | POA: Diagnosis not present

## 2020-02-03 MED ORDER — ASPIRIN EC 81 MG PO TBEC: 81.0000 mg | DELAYED_RELEASE_TABLET | Freq: Every day | ORAL | 3 refills | Status: DC

## 2020-02-03 NOTE — Assessment & Plan Note (Signed)
Continues to have bilateral lower extremity edema, adding a urinalysis to blood work. As below we will be doing a nuclear stress test which should give [place] some idea of ejection fraction. Ultimately I do think this is just benign dependent edema from gravity and time on his feet.

## 2020-02-03 NOTE — Progress Notes (Addendum)
Subjective:    CC: Annual Physical Exam  HPI:  This patient is here for their annual physical  I reviewed the past medical history, family history, social history, surgical history, and allergies today and no changes were needed.  Please see the problem list section below in epic for further details.  Past Medical History: No past medical history on file. Past Surgical History: Past Surgical History:  Procedure Laterality Date  . APPENDECTOMY     Social History: Social History   Socioeconomic History  . Marital status: Married    Spouse name: Not on file  . Number of children: Not on file  . Years of education: Not on file  . Highest education level: Not on file  Occupational History  . Not on file  Tobacco Use  . Smoking status: Former Research scientist (life sciences)  . Smokeless tobacco: Never Used  Substance and Sexual Activity  . Alcohol use: No  . Drug use: No  . Sexual activity: Yes  Other Topics Concern  . Not on file  Social History Narrative  . Not on file   Social Determinants of Health   Financial Resource Strain:   . Difficulty of Paying Living Expenses:   Food Insecurity:   . Worried About Charity fundraiser in the Last Year:   . Arboriculturist in the Last Year:   Transportation Needs:   . Film/video editor (Medical):   Marland Kitchen Lack of Transportation (Non-Medical):   Physical Activity:   . Days of Exercise per Week:   . Minutes of Exercise per Session:   Stress:   . Feeling of Stress :   Social Connections:   . Frequency of Communication with Friends and Family:   . Frequency of Social Gatherings with Friends and Family:   . Attends Religious Services:   . Active Member of Clubs or Organizations:   . Attends Archivist Meetings:   Marland Kitchen Marital Status:    Family History: No family history on file. Allergies: No Known Allergies Medications: See med rec.  Review of Systems: No headache, visual changes, nausea, vomiting, diarrhea, constipation, dizziness,  abdominal pain, skin rash, fevers, chills, night sweats, swollen lymph nodes, weight loss, chest pain, body aches, joint swelling, muscle aches, shortness of breath, mood changes, visual or auditory hallucinations.  Objective:    General: Well Developed, well nourished, and in no acute distress.  Neuro: Alert and oriented x3, extra-ocular muscles intact, sensation grossly intact. Cranial nerves II through XII are intact, motor, sensory, and coordinative functions are all intact. HEENT: Normocephalic, atraumatic, pupils equal round reactive to light, neck supple, no masses, no lymphadenopathy, thyroid nonpalpable. Oropharynx, nasopharynx, external ear canals are unremarkable. Skin: Warm and dry, no rashes noted.  Cardiac: Regular rate and rhythm, no murmurs rubs or gallops.  Respiratory: Clear to auscultation bilaterally. Not using accessory muscles, speaking in full sentences.  Abdominal: Soft, nontender, nondistended, positive bowel sounds, no masses, no organomegaly.  Musculoskeletal: Shoulder, elbow, wrist, hip, knee, ankle stable, and with full range of motion.  Twelve-lead ECG obtained and personally reviewed, he has normal sinus rhythm at a rate of 66 bpm, equivocal left axis deviation, no ST changes, no PR changes, no Q waves.  Impression and Recommendations:    The patient was counselled, risk factors were discussed, anticipatory guidance given.  Annual physical exam Today we performed a routine physical, he is due for colon cancer screening, adding Cologuard testing.   Bilateral lower extremity edema Continues to have bilateral  lower extremity edema, adding a urinalysis to blood work. As below we will be doing a nuclear stress test which should give [place] some idea of ejection fraction. Ultimately I do think this is just benign dependent edema from gravity and time on his feet.  Hyperlipidemia Rechecking labs including lipids.  Lipids continue to be very elevated, adding  atorvastatin 20 mg daily, recheck lipids in 3 months.  Onychodystrophy likely secondary to onychomycosis Nails have been thick, yellow, crusty. Back in March we added Lamisil, I am starting to see a clear nail growing from the matrix, we will treat him for 6-9 total months, this will be through the end of the year before considering nail removal.  Atypical chest pain Did have an episode of atypical chest pain, mild substernal at rest, no nausea, diaphoresis, he did have some discomfort in his neck and in his left arm. No nausea, no diaphoresis.  No palpitations. He will have difficulty on a treadmill so I am going to go ahead and order a nuclear stress test. We are risk stratifying him with his labs and I would like to add a baby aspirin.  Nuclear stress test shows no abnormal perfusion however its overall systolic function is weaker than it should be, my recommendation is to get an official echocardiogram to further evaluate this and determine if there is actually any evidence of heart failure.  Male hypogonadism Borderline low testosterone levels, I am happy to go ahead and start injectable testosterone, recheck CBC, PSA, testosterone 1 week after his second injection.   ___________________________________________ Ihor Austin. Benjamin Stain, M.D., ABFM., CAQSM. Primary Care and Sports Medicine [facility]  Adjunct Professor of Family Medicine  [employer]

## 2020-02-03 NOTE — Assessment & Plan Note (Addendum)
Did have an episode of atypical chest pain, mild substernal at rest, no nausea, diaphoresis, he did have some discomfort in his neck and in his left arm. No nausea, no diaphoresis.  No palpitations. He will have difficulty on a treadmill so I am going to go ahead and order a nuclear stress test. We are risk stratifying him with his labs and I would like to add a baby aspirin.  Nuclear stress test shows no abnormal perfusion however its overall systolic function is weaker than it should be, my recommendation is to get an official echocardiogram to further evaluate this and determine if there is actually any evidence of heart failure.

## 2020-02-03 NOTE — Assessment & Plan Note (Signed)
Today we performed a routine physical, he is due for colon cancer screening, adding Cologuard testing.

## 2020-02-03 NOTE — Assessment & Plan Note (Signed)
Nails have been thick, yellow, crusty. Back in March we added Lamisil, I am starting to see a clear nail growing from the matrix, we will treat him for 6-9 total months, this will be through the end of the year before considering nail removal.

## 2020-02-03 NOTE — Assessment & Plan Note (Addendum)
Rechecking labs including lipids.  Lipids continue to be very elevated, adding atorvastatin 20 mg daily, recheck lipids in 3 months.

## 2020-02-05 MED ORDER — VITAMIN D (ERGOCALCIFEROL) 1.25 MG (50000 UNIT) PO CAPS: 50000.0000 [IU] | ORAL_CAPSULE | ORAL | 0 refills | Status: DC

## 2020-02-05 MED ORDER — ATORVASTATIN CALCIUM 20 MG PO TABS: 20.0000 mg | ORAL_TABLET | Freq: Every day | ORAL | 3 refills | Status: DC

## 2020-02-05 NOTE — Addendum Note (Signed)
Addended by: Monica Becton on: 02/05/2020 12:09 PM   Modules accepted: Orders

## 2020-02-05 NOTE — Addendum Note (Signed)
Addended by: Monica Becton on: 02/05/2020 04:52 PM   Modules accepted: Orders

## 2020-02-08 LAB — NO CULTURE INDICATED

## 2020-02-08 LAB — CBC
HCT: 45.2 % (ref 38.5–50.0)
Hemoglobin: 15.4 g/dL (ref 13.2–17.1)
MCH: 31.1 pg (ref 27.0–33.0)
MCHC: 34.1 g/dL (ref 32.0–36.0)
MCV: 91.3 fL (ref 80.0–100.0)
MPV: 11 fL (ref 7.5–12.5)
Platelets: 243 10*3/uL (ref 140–400)
RBC: 4.95 10*6/uL (ref 4.20–5.80)
RDW: 13.3 % (ref 11.0–15.0)
WBC: 7.4 10*3/uL (ref 3.8–10.8)

## 2020-02-08 LAB — COMPLETE METABOLIC PANEL WITH GFR
AG Ratio: 1.6 (calc) (ref 1.0–2.5)
ALT: 20 U/L (ref 9–46)
AST: 17 U/L (ref 10–35)
Albumin: 4.1 g/dL (ref 3.6–5.1)
Alkaline phosphatase (APISO): 73 U/L (ref 35–144)
BUN: 14 mg/dL (ref 7–25)
CO2: 26 mmol/L (ref 20–32)
Calcium: 9.2 mg/dL (ref 8.6–10.3)
Chloride: 105 mmol/L (ref 98–110)
Creat: 0.94 mg/dL (ref 0.70–1.33)
GFR, Est African American: 103 mL/min/{1.73_m2} (ref >=60)
GFR, Est Non African American: 89 mL/min/{1.73_m2} (ref >=60)
Globulin: 2.6 g/dL (calc) (ref 1.9–3.7)
Glucose, Bld: 106 mg/dL — ABNORMAL HIGH (ref 65–99)
Potassium: 4.5 mmol/L (ref 3.5–5.3)
Sodium: 139 mmol/L (ref 135–146)
Total Bilirubin: 0.6 mg/dL (ref 0.2–1.2)
Total Protein: 6.7 g/dL (ref 6.1–8.1)

## 2020-02-08 LAB — URINALYSIS W MICROSCOPIC + REFLEX CULTURE
Bacteria, UA: NONE SEEN /HPF
Bilirubin Urine: NEGATIVE
Glucose, UA: NEGATIVE
Hgb urine dipstick: NEGATIVE
Hyaline Cast: NONE SEEN /LPF
Ketones, ur: NEGATIVE
Leukocyte Esterase: NEGATIVE
Nitrites, Initial: NEGATIVE
Protein, ur: NEGATIVE
RBC / HPF: NONE SEEN /HPF (ref 0–2)
Specific Gravity, Urine: 1.016 (ref 1.001–1.03)
Squamous Epithelial / HPF: NONE SEEN /HPF (ref <=5)
WBC, UA: NONE SEEN /HPF (ref 0–5)
pH: 5.5 (ref 5.0–8.0)

## 2020-02-08 LAB — PSA, TOTAL AND FREE
PSA, % Free: 31 % (calc) (ref >25)
PSA, Free: 0.4 ng/mL
PSA, Total: 1.3 ng/mL (ref <=4.0)

## 2020-02-08 LAB — LIPID PANEL W/REFLEX DIRECT LDL
Cholesterol: 227 mg/dL — ABNORMAL HIGH (ref <200)
HDL: 35 mg/dL — ABNORMAL LOW (ref >=40)
LDL Cholesterol (Calc): 158 mg/dL (calc) — ABNORMAL HIGH
Non-HDL Cholesterol (Calc): 192 mg/dL (calc) — ABNORMAL HIGH (ref <130)
Total CHOL/HDL Ratio: 6.5 (calc) — ABNORMAL HIGH (ref <5.0)
Triglycerides: 181 mg/dL — ABNORMAL HIGH (ref <150)

## 2020-02-08 LAB — TSH: TSH: 1.18 mIU/L (ref 0.40–4.50)

## 2020-02-08 LAB — TESTOSTERONE, FREE & TOTAL
Free Testosterone: 38.5 pg/mL (ref 35.0–155.0)
Testosterone, Total, LC-MS-MS: 273 ng/dL (ref 250–1100)

## 2020-02-08 LAB — VITAMIN D 25 HYDROXY (VIT D DEFICIENCY, FRACTURES): Vit D, 25-Hydroxy: 23 ng/mL — ABNORMAL LOW (ref 30–100)

## 2020-02-19 MED ORDER — TESTOSTERONE CYPIONATE 200 MG/ML IM SOLN: 200.0000 mg | INTRAMUSCULAR | 0 refills | Status: DC

## 2020-02-19 NOTE — Addendum Note (Signed)
Addended by: Monica Becton on: 02/19/2020 04:55 PM   Modules accepted: Orders

## 2020-02-19 NOTE — Assessment & Plan Note (Signed)
Borderline low testosterone levels, I am happy to go ahead and start injectable testosterone, recheck CBC, PSA, testosterone 1 week after his second injection.

## 2020-03-10 ENCOUNTER — Telehealth: Payer: Self-pay

## 2020-03-10 NOTE — Telephone Encounter (Signed)
[employer]/gallery curator for Vitamin D.

## 2020-03-10 NOTE — Telephone Encounter (Signed)
It was linked to the diagnosis of vitamin D deficiency, it does not get better than that.

## 2020-03-11 NOTE — Telephone Encounter (Signed)
Added

## 2020-03-11 NOTE — Telephone Encounter (Signed)
I guess it didn't crossover to the order form. Thanks

## 2020-03-11 NOTE — Telephone Encounter (Signed)
Maybe I linked it wrong (wouldn't be the first time).  Either way we can use "vitamin d deficiency.  "

## 2020-04-06 ENCOUNTER — Encounter (HOSPITAL_COMMUNITY): Payer: Self-pay | Admitting: Sports Medicine

## 2020-04-07 ENCOUNTER — Encounter (HOSPITAL_COMMUNITY): Payer: Self-pay | Admitting: *Deleted

## 2020-04-07 ENCOUNTER — Telehealth (HOSPITAL_COMMUNITY): Payer: Self-pay | Admitting: *Deleted

## 2020-04-07 NOTE — Telephone Encounter (Signed)
Left message on voicemail in reference to upcoming appointment scheduled for 04/13/20. Phone number given for a call back so details instructions can be given. ,  Jose Jimenez ° ° °

## 2020-04-13 ENCOUNTER — Ambulatory Visit (HOSPITAL_COMMUNITY): Payer: Managed Care, Other (non HMO)

## 2020-04-14 ENCOUNTER — Other Ambulatory Visit: Payer: Self-pay

## 2020-04-14 ENCOUNTER — Ambulatory Visit (HOSPITAL_COMMUNITY): Payer: Managed Care, Other (non HMO) | Attending: Cardiovascular Disease

## 2020-04-14 VITALS — Ht 75.0 in | Wt 288.0 lb

## 2020-04-14 DIAGNOSIS — R0789 Other chest pain: Secondary | ICD-10-CM | POA: Insufficient documentation

## 2020-04-14 DIAGNOSIS — R079 Chest pain, unspecified: Secondary | ICD-10-CM | POA: Insufficient documentation

## 2020-04-14 MED ORDER — TECHNETIUM TC 99M TETROFOSMIN IV KIT
31.6000 | PACK | Freq: Once | INTRAVENOUS | Status: AC | PRN
  Administered 2020-04-14: 31.6 via INTRAVENOUS
  Filled 2020-04-14: qty 32

## 2020-04-14 MED ORDER — REGADENOSON 0.4 MG/5ML IV SOLN
0.4000 mg | Freq: Once | INTRAVENOUS | Status: AC
  Administered 2020-04-14: 0.4 mg via INTRAVENOUS

## 2020-04-15 ENCOUNTER — Ambulatory Visit (HOSPITAL_COMMUNITY): Payer: Managed Care, Other (non HMO) | Attending: Cardiovascular Disease

## 2020-04-15 ENCOUNTER — Other Ambulatory Visit: Payer: Self-pay

## 2020-04-15 LAB — MYOCARDIAL PERFUSION IMAGING
LV dias vol: 106 mL (ref 62–150)
LV sys vol: 66 mL
Peak HR: 109 {beats}/min
Rest HR: 80 {beats}/min
SDS: 2
SRS: 0
SSS: 2
TID: 1.06

## 2020-04-15 MED ORDER — TECHNETIUM TC 99M TETROFOSMIN IV KIT
30.4000 | PACK | Freq: Once | INTRAVENOUS | Status: AC | PRN
  Administered 2020-04-15: 30.4 via INTRAVENOUS
  Filled 2020-04-15: qty 31

## 2020-04-15 NOTE — Addendum Note (Signed)
Addended by: Monica Becton on: 04/15/2020 04:08 PM   Modules accepted: Orders

## 2020-05-18 ENCOUNTER — Ambulatory Visit (HOSPITAL_BASED_OUTPATIENT_CLINIC_OR_DEPARTMENT_OTHER)
Admission: RE | Admit: 2020-05-18 | Discharge: 2020-05-18 | Disposition: A | Discharge status: Home / Self Care | Payer: Managed Care, Other (non HMO) | Source: Ambulatory Visit | Attending: Sports Medicine | Admitting: Sports Medicine

## 2020-05-18 ENCOUNTER — Other Ambulatory Visit: Payer: Self-pay

## 2020-05-18 DIAGNOSIS — R0789 Other chest pain: Secondary | ICD-10-CM | POA: Insufficient documentation

## 2020-05-18 LAB — ECHOCARDIOGRAM COMPLETE
Area-P 1/2: 2.6 cm2
S' Lateral: 3.1 cm

## 2020-10-27 ENCOUNTER — Telehealth (INDEPENDENT_AMBULATORY_CARE_PROVIDER_SITE_OTHER): Payer: Managed Care, Other (non HMO) | Admitting: Sports Medicine

## 2020-10-27 DIAGNOSIS — J111 Influenza due to unidentified influenza virus with other respiratory manifestations: Secondary | ICD-10-CM | POA: Insufficient documentation

## 2020-10-27 MED ORDER — OSELTAMIVIR PHOSPHATE 75 MG PO CAPS: 75.0000 mg | ORAL_CAPSULE | Freq: Two times a day (BID) | ORAL | 0 refills | Status: DC

## 2020-10-27 NOTE — Progress Notes (Signed)
   Virtual Visit via Telephone   I connected with  Jose Jimenez  on 10/27/20 by telephone/telehealth and verified that I am speaking with the correct person using two identifiers.   I discussed the limitations, risks, security and privacy concerns of performing an evaluation and management service by telephone, including the higher likelihood of inaccurate diagnosis and treatment, and the availability of in person appointments.  We also discussed the likely need of an additional face to face encounter for complete and high quality delivery of care.  I also discussed with the patient that there may be a patient responsible charge related to this service. The patient expressed understanding and wishes to proceed.  Provider location is in medical facility. Patient location is at their home, different from provider location. People involved in care of the patient during this telehealth encounter were myself, my nurse/medical assistant, and my front office/scheduling team member.  Review of Systems: No fevers, chills, night sweats, weight loss, chest pain, or shortness of breath.   Objective Findings:    General: Speaking full sentences, no audible heavy breathing.  Sounds alert and appropriately interactive.    Independent interpretation of tests performed by another provider:   None.  Brief History, Exam, Impression, and Recommendations:    Influenza-like illness Daughter came to town to visit, 2 days ago, now he has temperatures with fever up to 101 Fahrenheit, muscle aches and body aches, mild fatigue, no anosmia, no ageusia, he has had two shots of his Pfizer vaccine, not boosted yet. No shortness of breath, no chest pain. Speaking full sentences on the phone. Adding Tamiflu as I do think this is more likely influenza based on symptoms and not losing smell or taste. He will quarantine for the full 5 days followed by mask for afterwards. Over-the-counter cold and flu medications for  symptomatic relief. Return to see me if no better in a couple of weeks.   I discussed the above assessment and treatment plan with the patient. The patient was provided an opportunity to ask questions and all were answered. The patient agreed with the plan and demonstrated an understanding of the instructions.   The patient was advised to call back or seek an in-person evaluation if the symptoms worsen or if the condition fails to improve as anticipated.   I provided 30 minutes of face to face and non-face-to-face time during this encounter date, time was needed to gather information, review chart, records, communicate/coordinate with staff remotely, as well as complete documentation.   ___________________________________________ Ihor Austin. Benjamin Stain, M.D., ABFM., CAQSM. Primary Care and Sports Medicine [facility]  Adjunct Professor of Family Medicine  [employer]

## 2020-10-27 NOTE — Assessment & Plan Note (Signed)
Daughter came to town to visit, 2 days ago, now he has temperatures with fever up to 101 Fahrenheit, muscle aches and body aches, mild fatigue, no anosmia, no ageusia, he has had two shots of his Pfizer vaccine, not boosted yet. No shortness of breath, no chest pain. Speaking full sentences on the phone. Adding Tamiflu as I do think this is more likely influenza based on symptoms and not losing smell or taste. He will quarantine for the full 5 days followed by mask for afterwards. Over-the-counter cold and flu medications for symptomatic relief. Return to see me if no better in a couple of weeks.

## 2021-06-14 ENCOUNTER — Encounter: Payer: Self-pay | Admitting: Family Medicine

## 2021-09-04 ENCOUNTER — Ambulatory Visit (INDEPENDENT_AMBULATORY_CARE_PROVIDER_SITE_OTHER): Payer: Managed Care, Other (non HMO)

## 2021-09-04 ENCOUNTER — Ambulatory Visit (INDEPENDENT_AMBULATORY_CARE_PROVIDER_SITE_OTHER): Payer: Managed Care, Other (non HMO) | Admitting: Physician Assistant

## 2021-09-04 ENCOUNTER — Other Ambulatory Visit: Payer: Self-pay

## 2021-09-04 ENCOUNTER — Encounter: Payer: Self-pay | Admitting: Physician Assistant

## 2021-09-04 VITALS — BP 119/81 | HR 81 | Temp 98.5°F | Ht 75.0 in | Wt 302.0 lb

## 2021-09-04 DIAGNOSIS — M79675 Pain in left toe(s): Secondary | ICD-10-CM

## 2021-09-04 DIAGNOSIS — N529 Male erectile dysfunction, unspecified: Secondary | ICD-10-CM

## 2021-09-04 DIAGNOSIS — Z79899 Other long term (current) drug therapy: Secondary | ICD-10-CM | POA: Diagnosis not present

## 2021-09-04 DIAGNOSIS — Z23 Encounter for immunization: Secondary | ICD-10-CM | POA: Diagnosis not present

## 2021-09-04 DIAGNOSIS — Z131 Encounter for screening for diabetes mellitus: Secondary | ICD-10-CM

## 2021-09-04 DIAGNOSIS — Z13220 Encounter for screening for lipoid disorders: Secondary | ICD-10-CM

## 2021-09-04 DIAGNOSIS — M7989 Other specified soft tissue disorders: Secondary | ICD-10-CM

## 2021-09-04 DIAGNOSIS — E291 Testicular hypofunction: Secondary | ICD-10-CM | POA: Diagnosis not present

## 2021-09-04 MED ORDER — SILDENAFIL CITRATE 100 MG PO TABS
50.0000 mg | ORAL_TABLET | Freq: Every day | ORAL | 5 refills | Status: DC | PRN
Start: 2021-09-04 — End: 2022-05-14

## 2021-09-04 MED ORDER — TESTOSTERONE CYPIONATE 200 MG/ML IM SOLN: 100.0000 mg | INTRAMUSCULAR | 0 refills | Status: DC

## 2021-09-04 MED ORDER — PREDNISONE 50 MG PO TABS: ORAL_TABLET | ORAL | 0 refills | Status: DC

## 2021-09-04 NOTE — Progress Notes (Signed)
Subjective:    Patient ID: Jose Jimenez, male    DOB: 12-10-1960, 60 y.o.   MRN: [id number]  HPI Pt is a 60 yo obese male with hypogonadism, HLD who presents to the clinic with left great toe and bunion pain for the last 4 days. Never had gout before. Denies any excessive alcohol, red meat, seafood. No trauma. Not tried anything to make better. Seems to be getting better on its on.   Pt feels better on testosterone .66ml weekly.   Problems with erections. Not every time but many times. Desire is there but not able to maintain erection.   .. Active Ambulatory Problems    Diagnosis Date Noted   Annual physical exam 03/13/2013   History of [place] spotted fever 03/13/2013   Obesity 03/13/2013   Hyperlipidemia 03/16/2013   Male hypogonadism 03/16/2013   Obstructive uropathy 11/15/2014   Excessive daytime sleepiness 01/16/2016   Bilateral lower extremity edema 05/15/2016   Primary osteoarthritis of left knee 11/14/2017   Right lumbar radiculitis 01/14/2019   Shift work sleep disorder 09/15/2019   Onychodystrophy likely secondary to onychomycosis 12/18/2019   Verruca 12/18/2019   Atypical chest pain 02/03/2020   Influenza-like illness 10/27/2020   Erectile dysfunction 09/04/2021   Dyslipidemia (high LDL; low HDL) 09/05/2021   Great toe pain, left 09/05/2021   Resolved Ambulatory Problems    Diagnosis Date Noted   Syncope 03/13/2013   Sore throat 04/10/2013   Paronychia of third finger, right 05/15/2013   Viral syndrome 06/29/2013   Seborrheic keratosis 08/10/2013   Sinusitis 01/16/2016   Acute abdominal pain in left flank 03/05/2017   No Additional Past Medical History      Review of Systems See HPI.     Objective:   Physical Exam    ..  SHIM     Row Name 09/04/21 0906         SHIM: Over the last 6 months:   How do you rate your confidence that you could get and keep an erection? Moderate     When you had erections with sexual stimulation, how often  were your erections hard enough for penetration (entering your partner)? Sometimes (about half the time)     During sexual intercourse, how often were you able to maintain your erection after you had penetrated (entered) your partner? Most Times (much more than half the time)     During sexual intercourse, how difficult was it to maintain your erection to completion of intercourse? Slightly Difficult     When you attempted sexual intercourse, how often was it satisfactory for you? Sometimes (about half the time)       SHIM Total Score   SHIM 17                  Assessment & Plan:  .Jose Jimenez was seen today for toe pain and hypogonadism.  Diagnoses and all orders for this visit:  Great toe pain, left -     Uric acid -     COMPLETE METABOLIC PANEL WITH GFR -     predniSONE (DELTASONE) 50 MG tablet; One tab PO daily for 5 days. -     DG Toe Great Left; Future  Male hypogonadism -     COMPLETE METABOLIC PANEL WITH GFR -     PSA -     CBC with Differential/Platelet -     testosterone cypionate (DEPOTESTOSTERONE CYPIONATE) 200 MG/ML injection; Inject 0.5 mLs (100 mg total) into the muscle  once a week. Please include 18-gauge needles for drawing up, 22-gauge needles for injection, and syringes  Need for influenza vaccination -     Flu Vaccine QUAD 26mo+IM (Fluarix, Fluzone & Alfiuria Quad PF)  Erectile dysfunction, unspecified erectile dysfunction type -     COMPLETE METABOLIC PANEL WITH GFR -     sildenafil (VIAGRA) 100 MG tablet; Take 0.5-1 tablets (50-100 mg total) by mouth daily as needed for erectile dysfunction.  Medication management -     Uric acid -     COMPLETE METABOLIC PANEL WITH GFR -     Lipid Panel w/reflex Direct LDL -     PSA -     CBC with Differential/Platelet  Screening for lipid disorders -     Lipid Panel w/reflex Direct LDL  Screening for diabetes mellitus -     COMPLETE METABOLIC PANEL WITH GFR   MTP and bunion pain arthritis vs gout.  Will check  uric acid. Not very warm to touch or red today.  Xray ordered.  Prednisone burst ordered.  Follow up with PCP as needed.   Needs fasting labs ordered today.   Discussed ED and hypogonadism.  Refilled testosterone.  SHIM high. Start viagra PRN.

## 2021-09-04 NOTE — Patient Instructions (Addendum)
Get xrays Get labs.  Prednisone for 5 days.  Testosterone refilled. Trial of viagra     Influenza (Flu) Vaccine (Inactivated or Recombinant): What You Need to Know 1. Why get vaccinated? Influenza vaccine can prevent influenza (flu). Flu is a contagious disease that spreads around the [place] every year, usually between October and May. Anyone can get the flu, but it is more dangerous for some people. Infants and young children, people 78 years and older, pregnant people, and people with certain health conditions or a weakened immune system are at greatest risk of flu complications. Pneumonia, bronchitis, sinus infections, and ear infections are examples of flu-related complications. If you have a medical condition, such as heart disease, cancer, or diabetes, flu can make it worse. Flu can cause fever and chills, sore throat, muscle aches, fatigue, cough, headache, and runny or stuffy nose. Some people may have vomiting and diarrhea, though this is more common in children than adults. In an average year, thousands of people in the [place] die from flu, and many more are hospitalized. Flu vaccine prevents millions of illnesses and flu-related visits to the doctor each year. 2. Influenza vaccines CDC recommends everyone 6 months and older get vaccinated every flu season. Children 6 months through 53 years of age may need 2 doses during a single flu season. Everyone else needs only 1 dose each flu season. It takes about 2 weeks for protection to develop after vaccination. There are many flu viruses, and they are always changing. Each year a new flu vaccine is made to protect against the influenza viruses believed to be likely to cause disease in the upcoming flu season. Even when the vaccine doesn't exactly match these viruses, it may still provide some protection. Influenza vaccine does not cause flu. Influenza vaccine may be given at the same time as other vaccines. 3. Talk with your  health care provider Tell your vaccination provider if the person getting the vaccine: Has had an allergic reaction after a previous dose of influenza vaccine, or has any severe, life-threatening allergies Has ever had Guillain-Barr Syndrome (also called "GBS") In some cases, your health care provider may decide to postpone influenza vaccination until a future visit. Influenza vaccine can be administered at any time during pregnancy. People who are or will be pregnant during influenza season should receive inactivated influenza vaccine. People with minor illnesses, such as a cold, may be vaccinated. People who are moderately or severely ill should usually wait until they recover before getting influenza vaccine. Your health care provider can give you more information. 4. Risks of a vaccine reaction Soreness, redness, and swelling where the shot is given, fever, muscle aches, and headache can happen after influenza vaccination. There may be a very small increased risk of Guillain-Barr Syndrome (GBS) after inactivated influenza vaccine (the flu shot). Young children who get the flu shot along with pneumococcal vaccine (PCV13) and/or DTaP vaccine at the same time might be slightly more likely to have a seizure caused by fever. Tell your health care provider if a child who is getting flu vaccine has ever had a seizure. People sometimes faint after medical procedures, including vaccination. Tell your provider if you feel dizzy or have vision changes or ringing in the ears. As with any medicine, there is a very remote chance of a vaccine causing a severe allergic reaction, other serious injury, or death. 5. What if there is a serious problem? An allergic reaction could occur after the vaccinated person leaves the  clinic. If you see signs of a severe allergic reaction (hives, swelling of the face and throat, difficulty breathing, a fast heartbeat, dizziness, or weakness), call 9-1-1 and get the person to  the nearest hospital. For other signs that concern you, call your health care provider. Adverse reactions should be reported to the Vaccine Adverse Event Reporting System (VAERS). Your health care provider will usually file this report, or you can do it yourself. Visit the VAERS website at www.vaers.LAgents.no or call 470-814-7500. VAERS is only for reporting reactions, and VAERS staff members do not give medical advice. 6. The National Vaccine Injury Compensation Program The [employer] Vaccine Injury Compensation Program (VICP) is a federal program that was created to compensate people who may have been injured by certain vaccines. Claims regarding alleged injury or death due to vaccination have a time limit for filing, which may be as short as two years. Visit the VICP website at SpiritualWord.at or call 613-674-4686 to learn about the program and about filing a claim. 7. How can I learn more? Ask your health care provider. Call your local or state health department. Visit the website of the Food and Drug Administration (FDA) for vaccine package inserts and additional information at FinderList.no. Contact the Centers for Disease Control and Prevention (CDC): Call (502)511-4344 (1-800-CDC-INFO) or Visit CDC's website at BiotechRoom.com.cy. Vaccine Information Statement Inactivated Influenza Vaccine (05/20/2020) This information is not intended to replace advice given to you by your health care provider. Make sure you discuss any questions you have with your health care provider. Document Revised: 06/22/2021 Document Reviewed: 06/22/2021 Elsevier Patient Education  2022 [employer].

## 2021-09-05 ENCOUNTER — Encounter: Payer: Self-pay | Admitting: Physician Assistant

## 2021-09-05 DIAGNOSIS — E785 Hyperlipidemia, unspecified: Secondary | ICD-10-CM | POA: Insufficient documentation

## 2021-09-05 DIAGNOSIS — M79675 Pain in left toe(s): Secondary | ICD-10-CM | POA: Insufficient documentation

## 2021-09-05 LAB — LIPID PANEL W/REFLEX DIRECT LDL
Cholesterol: 229 mg/dL — ABNORMAL HIGH (ref <200)
HDL: 33 mg/dL — ABNORMAL LOW (ref >=40)
LDL Cholesterol (Calc): 168 mg/dL (calc) — ABNORMAL HIGH
Non-HDL Cholesterol (Calc): 196 mg/dL (calc) — ABNORMAL HIGH (ref <130)
Total CHOL/HDL Ratio: 6.9 (calc) — ABNORMAL HIGH (ref <5.0)
Triglycerides: 139 mg/dL (ref <150)

## 2021-09-05 LAB — COMPLETE METABOLIC PANEL WITH GFR
AG Ratio: 1.6 (calc) (ref 1.0–2.5)
ALT: 21 U/L (ref 9–46)
AST: 18 U/L (ref 10–35)
Albumin: 4 g/dL (ref 3.6–5.1)
Alkaline phosphatase (APISO): 64 U/L (ref 35–144)
BUN: 14 mg/dL (ref 7–25)
CO2: 25 mmol/L (ref 20–32)
Calcium: 9.3 mg/dL (ref 8.6–10.3)
Chloride: 104 mmol/L (ref 98–110)
Creat: 1.14 mg/dL (ref 0.70–1.35)
Globulin: 2.5 g/dL (calc) (ref 1.9–3.7)
Glucose, Bld: 101 mg/dL — ABNORMAL HIGH (ref 65–99)
Potassium: 4.5 mmol/L (ref 3.5–5.3)
Sodium: 137 mmol/L (ref 135–146)
Total Bilirubin: 0.9 mg/dL (ref 0.2–1.2)
Total Protein: 6.5 g/dL (ref 6.1–8.1)
eGFR: 74 mL/min/{1.73_m2} (ref >=60)

## 2021-09-05 LAB — CBC WITH DIFFERENTIAL/PLATELET
Absolute Monocytes: 765 {cells}/uL (ref 200–950)
Basophils Absolute: 94 {cells}/uL (ref 0–200)
Basophils Relative: 1.1 %
Eosinophils Absolute: 264 {cells}/uL (ref 15–500)
Eosinophils Relative: 3.1 %
HCT: 46.9 % (ref 38.5–50.0)
Hemoglobin: 16.4 g/dL (ref 13.2–17.1)
Lymphs Abs: 1700 {cells}/uL (ref 850–3900)
MCH: 32.1 pg (ref 27.0–33.0)
MCHC: 35 g/dL (ref 32.0–36.0)
MCV: 91.8 fL (ref 80.0–100.0)
MPV: 11.2 fL (ref 7.5–12.5)
Monocytes Relative: 9 %
Neutro Abs: 5678 {cells}/uL (ref 1500–7800)
Neutrophils Relative %: 66.8 %
Platelets: 260 Thousand/uL (ref 140–400)
RBC: 5.11 Million/uL (ref 4.20–5.80)
RDW: 13.4 % (ref 11.0–15.0)
Total Lymphocyte: 20 %
WBC: 8.5 Thousand/uL (ref 3.8–10.8)

## 2021-09-05 LAB — PSA: PSA: 3.6 ng/mL (ref <=4.00)

## 2021-09-05 LAB — URIC ACID: Uric Acid, Serum: 8.1 mg/dL — ABNORMAL HIGH (ref 4.0–8.0)

## 2021-09-05 NOTE — Progress Notes (Signed)
Uric acid is also elevated your pain could represent some arthritis and gout flare.  There is medication to lower your uric acid and keep flares from occurring. Recheck uric acid in 4 weeks and should talk with [facility] about preventative.  Your cholesterol continues to rise.  Your good cholesterol low and bad cholesterol elevated.  Your 10 year CV risk is 15.1 percent. You should really be on statin, a cholesterol lowering drug. Thoughts?

## 2021-09-05 NOTE — Progress Notes (Signed)
Xray looks like some arthritis.

## 2021-12-05 ENCOUNTER — Ambulatory Visit (INDEPENDENT_AMBULATORY_CARE_PROVIDER_SITE_OTHER): Payer: Managed Care, Other (non HMO) | Admitting: Sports Medicine

## 2021-12-05 ENCOUNTER — Encounter: Payer: Self-pay | Admitting: Sports Medicine

## 2021-12-05 ENCOUNTER — Other Ambulatory Visit: Payer: Self-pay

## 2021-12-05 VITALS — BP 108/73 | HR 72 | Ht 75.0 in | Wt 287.0 lb

## 2021-12-05 DIAGNOSIS — E785 Hyperlipidemia, unspecified: Secondary | ICD-10-CM | POA: Diagnosis not present

## 2021-12-05 DIAGNOSIS — Z1211 Encounter for screening for malignant neoplasm of colon: Secondary | ICD-10-CM | POA: Diagnosis not present

## 2021-12-05 DIAGNOSIS — M10072 Idiopathic gout, left ankle and foot: Secondary | ICD-10-CM

## 2021-12-05 DIAGNOSIS — Z0000 Encounter for general adult medical examination without abnormal findings: Secondary | ICD-10-CM

## 2021-12-05 DIAGNOSIS — L989 Disorder of the skin and subcutaneous tissue, unspecified: Secondary | ICD-10-CM

## 2021-12-05 DIAGNOSIS — M109 Gout, unspecified: Secondary | ICD-10-CM | POA: Insufficient documentation

## 2021-12-05 MED ORDER — ROSUVASTATIN CALCIUM 10 MG PO TABS: 10.0000 mg | ORAL_TABLET | Freq: Every day | ORAL | 3 refills | Status: DC

## 2021-12-05 NOTE — Assessment & Plan Note (Signed)
Mixed hyperlipidemia, starting Crestor 10, recheck in 2 to 3 months.

## 2021-12-05 NOTE — Assessment & Plan Note (Signed)
Jose Jimenez has several skin lesions, he has what appears to be a verruca on his hand, actinic keratosis over the dorsum of his hand and 2 seborrheic keratoses on his head, these were all treated with cryotherapy today.

## 2021-12-05 NOTE — Assessment & Plan Note (Signed)
Jose Jimenez saw Jose Jimenez recently, he had swelling of his left great toe, it popped up very quickly, was excruciatingly painful and responded well to steroids. Uric acid levels were mid eights. I think this was a flare of podagra, he will do a low purine diet, if he has another flare we will likely start allopurinol.

## 2021-12-05 NOTE — Assessment & Plan Note (Signed)
Due for colon cancer screening, ordering Cologuard.

## 2021-12-05 NOTE — Progress Notes (Signed)
° ° °  Procedures performed today:    Procedure:  Cryodestruction of wart right hand, actinic keratosis right hand, 2 seborrheic keratoses on the scalp Consent obtained and verified. Time-out conducted. Noted no overlying erythema, induration, or other signs of local infection. Completed without difficulty using Cryo-Gun. Advised to call if fevers/chills, erythema, induration, drainage, or persistent bleeding.  Independent interpretation of notes and tests performed by another provider:   None.  Brief History, Exam, Impression, and Recommendations:    Annual physical exam Due for colon cancer screening, ordering Cologuard.  Hyperlipidemia Mixed hyperlipidemia, starting Crestor 10, recheck in 2 to 3 months.  Gout Muaad saw Luvenia Starch recently, he had swelling of his left great toe, it popped up very quickly, was excruciatingly painful and responded well to steroids. Uric acid levels were mid eights. I think this was a flare of podagra, he will do a low purine diet, if he has another flare we will likely start allopurinol.   Skin lesion Markeece has several skin lesions, he has what appears to be a verruca on his hand, actinic keratosis over the dorsum of his hand and 2 seborrheic keratoses on his head, these were all treated with cryotherapy today.    ___________________________________________ Gwen Her. Dianah Field, M.D., ABFM., CAQSM. Primary Care and [place] Instructor of [employer] [employer] [facility] [employer]

## 2022-01-15 LAB — COLOGUARD: COLOGUARD: NEGATIVE

## 2022-01-29 ENCOUNTER — Other Ambulatory Visit: Payer: Self-pay | Admitting: Physician Assistant

## 2022-01-29 DIAGNOSIS — E291 Testicular hypofunction: Secondary | ICD-10-CM

## 2022-02-02 ENCOUNTER — Encounter: Payer: Self-pay | Admitting: Sports Medicine

## 2022-02-02 ENCOUNTER — Ambulatory Visit (INDEPENDENT_AMBULATORY_CARE_PROVIDER_SITE_OTHER): Payer: Managed Care, Other (non HMO) | Admitting: Sports Medicine

## 2022-02-02 VITALS — BP 131/90 | HR 68 | Ht 75.0 in | Wt 286.0 lb

## 2022-02-02 DIAGNOSIS — E291 Testicular hypofunction: Secondary | ICD-10-CM | POA: Diagnosis not present

## 2022-02-02 DIAGNOSIS — L989 Disorder of the skin and subcutaneous tissue, unspecified: Secondary | ICD-10-CM

## 2022-02-02 DIAGNOSIS — B079 Viral wart, unspecified: Secondary | ICD-10-CM

## 2022-02-02 DIAGNOSIS — E785 Hyperlipidemia, unspecified: Secondary | ICD-10-CM

## 2022-02-02 MED ORDER — TESTOSTERONE CYPIONATE 200 MG/ML IM SOLN
100.0000 mg | Freq: Once | INTRAMUSCULAR | Status: AC
  Administered 2022-02-02: 100 mg via INTRAMUSCULAR

## 2022-02-02 NOTE — Assessment & Plan Note (Signed)
Repeat cryotherapy verruca right hand. ?

## 2022-02-02 NOTE — Assessment & Plan Note (Signed)
Hyperlipidemia, currently on rosuvastatin, ordering routine labs. ?Patient will return fasting next week for these. ?

## 2022-02-02 NOTE — Assessment & Plan Note (Signed)
Repeat cryotherapy, this time on skin lesion left forearm resembling a squamous cell carcinoma/actinic keratosis. ?

## 2022-02-02 NOTE — Assessment & Plan Note (Signed)
Currently doing testosterone 0.5 mL weekly. ?We will check testosterone levels with part of his routine labs, we did give him a testosterone shot today. ?

## 2022-02-02 NOTE — Progress Notes (Signed)
? ? ?  Procedures performed today:   ? ?Procedure:  Cryodestruction of right hand verruca, left forearm squamous skin lesion ?Consent obtained and verified. ?Time-out conducted. ?Noted no overlying erythema, induration, or other signs of local infection. ?Completed without difficulty using Cryo-Gun. ?Advised to call if fevers/chills, erythema, induration, drainage, or persistent bleeding. ? ?Independent interpretation of notes and tests performed by another provider:  ? ?None. ? ?Brief History, Exam, Impression, and Recommendations:   ? ?Hyperlipidemia ?Hyperlipidemia, currently on rosuvastatin, ordering routine labs. ?Patient will return fasting next week for these. ? ?Male hypogonadism ?Currently doing testosterone 0.5 mL weekly. ?We will check testosterone levels with part of his routine labs, we did give him a testosterone shot today. ? ?Skin lesion ?Repeat cryotherapy, this time on skin lesion left forearm resembling a squamous cell carcinoma/actinic keratosis. ? ?Verruca ?Repeat cryotherapy verruca right hand. ? ? ? ?___________________________________________ ?Ihor Austin. Benjamin Stain, M.D., ABFM., CAQSM. ?Primary Care and Sports Medicine ?[facility] ? ?Adjunct Instructor of Family Medicine  ?[employer] ?

## 2022-02-02 NOTE — Addendum Note (Signed)
Addended by: Gaynelle Arabian on: 02/02/2022 09:19 AM ? ? Modules accepted: Orders ? ?

## 2022-02-09 LAB — LIPID PANEL
Cholesterol: 155 mg/dL (ref <200)
HDL: 32 mg/dL — ABNORMAL LOW (ref >=40)
LDL Cholesterol (Calc): 98 mg/dL (calc)
Non-HDL Cholesterol (Calc): 123 mg/dL (calc) (ref <130)
Total CHOL/HDL Ratio: 4.8 (calc) (ref <5.0)
Triglycerides: 158 mg/dL — ABNORMAL HIGH (ref <150)

## 2022-02-09 LAB — COMPREHENSIVE METABOLIC PANEL
AG Ratio: 1.7 (calc) (ref 1.0–2.5)
ALT: 18 U/L (ref 9–46)
AST: 17 U/L (ref 10–35)
Albumin: 4 g/dL (ref 3.6–5.1)
Alkaline phosphatase (APISO): 60 U/L (ref 35–144)
BUN: 16 mg/dL (ref 7–25)
CO2: 27 mmol/L (ref 20–32)
Calcium: 9 mg/dL (ref 8.6–10.3)
Chloride: 103 mmol/L (ref 98–110)
Creat: 1.05 mg/dL (ref 0.70–1.35)
Globulin: 2.4 g/dL (calc) (ref 1.9–3.7)
Glucose, Bld: 102 mg/dL — ABNORMAL HIGH (ref 65–99)
Potassium: 4.1 mmol/L (ref 3.5–5.3)
Sodium: 137 mmol/L (ref 135–146)
Total Bilirubin: 0.9 mg/dL (ref 0.2–1.2)
Total Protein: 6.4 g/dL (ref 6.1–8.1)

## 2022-02-09 LAB — CBC
HCT: 50.1 % — ABNORMAL HIGH (ref 38.5–50.0)
Hemoglobin: 17 g/dL (ref 13.2–17.1)
MCH: 30.9 pg (ref 27.0–33.0)
MCHC: 33.9 g/dL (ref 32.0–36.0)
MCV: 91.1 fL (ref 80.0–100.0)
MPV: 10.6 fL (ref 7.5–12.5)
Platelets: 221 10*3/uL (ref 140–400)
RBC: 5.5 10*6/uL (ref 4.20–5.80)
RDW: 13.5 % (ref 11.0–15.0)
WBC: 7.8 10*3/uL (ref 3.8–10.8)

## 2022-02-09 LAB — TESTOSTERONE, FREE & TOTAL
Free Testosterone: 67.7 pg/mL (ref 35.0–155.0)
Testosterone, Total, LC-MS-MS: 380 ng/dL (ref 250–1100)

## 2022-05-14 ENCOUNTER — Telehealth: Payer: Self-pay | Admitting: Sports Medicine

## 2022-05-14 DIAGNOSIS — N529 Male erectile dysfunction, unspecified: Secondary | ICD-10-CM

## 2022-05-14 MED ORDER — SILDENAFIL CITRATE 100 MG PO TABS
50.0000 mg | ORAL_TABLET | Freq: Every day | ORAL | 5 refills | Status: DC | PRN
Start: 2022-05-14 — End: 2023-04-15

## 2022-05-14 NOTE — Telephone Encounter (Signed)
Patient notified

## 2022-05-14 NOTE — Telephone Encounter (Signed)
Patient stopped in and requested a refill on Viagra - asap please - lmr.

## 2022-05-14 NOTE — Telephone Encounter (Signed)
Refill sent.

## 2022-07-10 ENCOUNTER — Telehealth: Payer: Self-pay

## 2022-07-10 DIAGNOSIS — E291 Testicular hypofunction: Secondary | ICD-10-CM

## 2022-07-10 MED ORDER — TESTOSTERONE CYPIONATE 200 MG/ML IM SOLN: 200.0000 mg | INTRAMUSCULAR | 0 refills | Status: DC

## 2022-07-10 NOTE — Telephone Encounter (Signed)
Sent!

## 2022-07-10 NOTE — Telephone Encounter (Signed)
Called patient and notified them of medication sent. Jose Jimenez

## 2022-07-10 NOTE — Telephone Encounter (Signed)
Pt stopped by to see if you would please send a Rx to the [facility] in [place]. Pt would like a call when the rx is sent to the Pharmacy. Tvt   testosterone cypionate (DEPOTESTOSTERONE CYPIONATE) 200 MG/ML injection

## 2022-10-11 ENCOUNTER — Encounter: Payer: Self-pay | Admitting: Sports Medicine

## 2022-11-18 ENCOUNTER — Other Ambulatory Visit: Payer: Self-pay | Admitting: Sports Medicine

## 2022-11-18 DIAGNOSIS — E291 Testicular hypofunction: Secondary | ICD-10-CM

## 2023-01-18 ENCOUNTER — Other Ambulatory Visit: Payer: Self-pay | Admitting: Sports Medicine

## 2023-01-18 DIAGNOSIS — E291 Testicular hypofunction: Secondary | ICD-10-CM

## 2023-03-13 ENCOUNTER — Ambulatory Visit (INDEPENDENT_AMBULATORY_CARE_PROVIDER_SITE_OTHER): Payer: Managed Care, Other (non HMO) | Admitting: Family Medicine

## 2023-03-13 ENCOUNTER — Encounter: Payer: Self-pay | Admitting: Family Medicine

## 2023-03-13 VITALS — BP 132/90 | HR 72 | Ht 75.0 in | Wt 293.0 lb

## 2023-03-13 DIAGNOSIS — J0100 Acute maxillary sinusitis, unspecified: Secondary | ICD-10-CM | POA: Insufficient documentation

## 2023-03-13 DIAGNOSIS — L603 Nail dystrophy: Secondary | ICD-10-CM

## 2023-03-13 MED ORDER — CICLOPIROX 8 % EX SOLN: Freq: Every day | CUTANEOUS | 0 refills | Status: DC

## 2023-03-13 MED ORDER — AMOXICILLIN-POT CLAVULANATE 875-125 MG PO TABS: 1.0000 | ORAL_TABLET | Freq: Two times a day (BID) | ORAL | 0 refills | Status: DC

## 2023-03-13 NOTE — Assessment & Plan Note (Signed)
Adding Penlac topical lacquer.   

## 2023-03-13 NOTE — Assessment & Plan Note (Signed)
Treating with course of Augmentin.  Encouraged supportive measures including increased fluid intake and over-the-counter analgesics as needed.  Contact clinic if symptoms are not improving.

## 2023-03-13 NOTE — Progress Notes (Signed)
Jose Jimenez - 62 y.o. male MRN [id number]  Date of birth: [birth year]  Subjective Chief Complaint  Patient presents with   URI   Nail Problem    HPI Jose Jimenez is a 62 year old male here today with complaint of right-sided facial pain and congestion.  Symptoms started about 4 to 5 days ago.  He has trouble blowing anything out of the right side of his nose.  He has not had fever or chills.  Symptoms are similar to previous sinus infections.  Denies cough or shortness of breath.  Has had issues with toenail fungus.  Previously on Lamisil and did have improvement with this but has developed recurrent issues with nail fungus.  ROS:  A comprehensive ROS was completed and negative except as noted per HPI  No Known Allergies  History reviewed. No pertinent past medical history.  Past Surgical History:  Procedure Laterality Date   APPENDECTOMY      Social History   Socioeconomic History   Marital status: Married    Spouse name: Not on file   Number of children: Not on file   Years of education: Not on file   Highest education level: Not on file  Occupational History   Not on file  Tobacco Use   Smoking status: Former   Smokeless tobacco: Never  Substance and Sexual Activity   Alcohol use: No   Drug use: No   Sexual activity: Yes  Other Topics Concern   Not on file  Social History Narrative   Not on file   Social Determinants of Health   Financial Resource Strain: Not on file  Food Insecurity: Not on file  Transportation Needs: Not on file  Physical Activity: Not on file  Stress: Not on file  Social Connections: Not on file    History reviewed. No pertinent family history.  Health Maintenance  Topic Date Due   COVID-19 Vaccine (3 - Pfizer risk series) 05/29/2023 (Originally 07/13/2020)   Zoster Vaccines- Shingrix (1 of 2) 06/13/2023 (Originally 05/15/1980)   INFLUENZA VACCINE  05/16/2023   DTaP/Tdap/Td (2 - Td or Tdap) 07/28/2023   Fecal DNA (Cologuard)   01/08/2025   Hepatitis C Screening  Completed   HIV Screening  Completed   HPV VACCINES  Aged Out   Colonoscopy  Discontinued     ----------------------------------------------------------------------------------------------------------------------------------------------------------------------------------------------------------------- Physical Exam BP (!) 132/90 (BP Location: Left Arm, Patient Position: Sitting, Cuff Size: Large)   Pulse 72   Ht 6\' 3"  (1.905 m)   Wt 293 lb (132.9 kg)   SpO2 95%   BMI 36.62 kg/m   Physical Exam Constitutional:      Appearance: Normal appearance.  HENT:     Head: Normocephalic and atraumatic.     Nose:     Comments: Right-sided maxillary sinus pain and tenderness Eyes:     General: No scleral icterus. Cardiovascular:     Rate and Rhythm: Normal rate and regular rhythm.  Pulmonary:     Effort: Pulmonary effort is normal.     Breath sounds: Normal breath sounds.  Lymphadenopathy:     Cervical: No cervical adenopathy.  Skin:    Comments: Dystrophic toenails with thickening and brittleness.  Neurological:     General: No focal deficit present.     Mental Status: He is alert.  Psychiatric:        Mood and Affect: Mood normal.        Behavior: Behavior normal.     ------------------------------------------------------------------------------------------------------------------------------------------------------------------------------------------------------------------- Assessment and Plan  Onychodystrophy likely secondary  to onychomycosis Adding Penlac topical lacquer.  Acute maxillary sinusitis Treating with course of Augmentin.  Encouraged supportive measures including increased fluid intake and over-the-counter analgesics as needed.  Contact clinic if symptoms are not improving.   Meds ordered this encounter  Medications   ciclopirox (PENLAC) 8 % solution    Sig: Apply topically at bedtime. Apply over nail and surrounding  skin. Apply daily over previous coat. After seven (7) days, may remove with alcohol and continue cycle.    Dispense:  6.6 mL    Refill:  0   amoxicillin-clavulanate (AUGMENTIN) 875-125 MG tablet    Sig: Take 1 tablet by mouth 2 (two) times daily.    Dispense:  20 tablet    Refill:  0    No follow-ups on file.    This visit occurred during the SARS-CoV-2 public health emergency.  Safety protocols were in place, including screening questions prior to the visit, additional usage of staff PPE, and extensive cleaning of exam room while observing appropriate contact time as indicated for disinfecting solutions.

## 2023-03-13 NOTE — Patient Instructions (Signed)

## 2023-04-12 ENCOUNTER — Other Ambulatory Visit: Payer: Self-pay | Admitting: Sports Medicine

## 2023-04-12 DIAGNOSIS — N529 Male erectile dysfunction, unspecified: Secondary | ICD-10-CM

## 2023-04-15 IMAGING — DX DG TOE GREAT 2+V*L*
3 series · 3 of 3 positions shown · non-contrast
Comparison: None.

CLINICAL DATA: Pain and swelling over the left first MTP joint.

EXAM:
LEFT GREAT TOE

[toe ap]
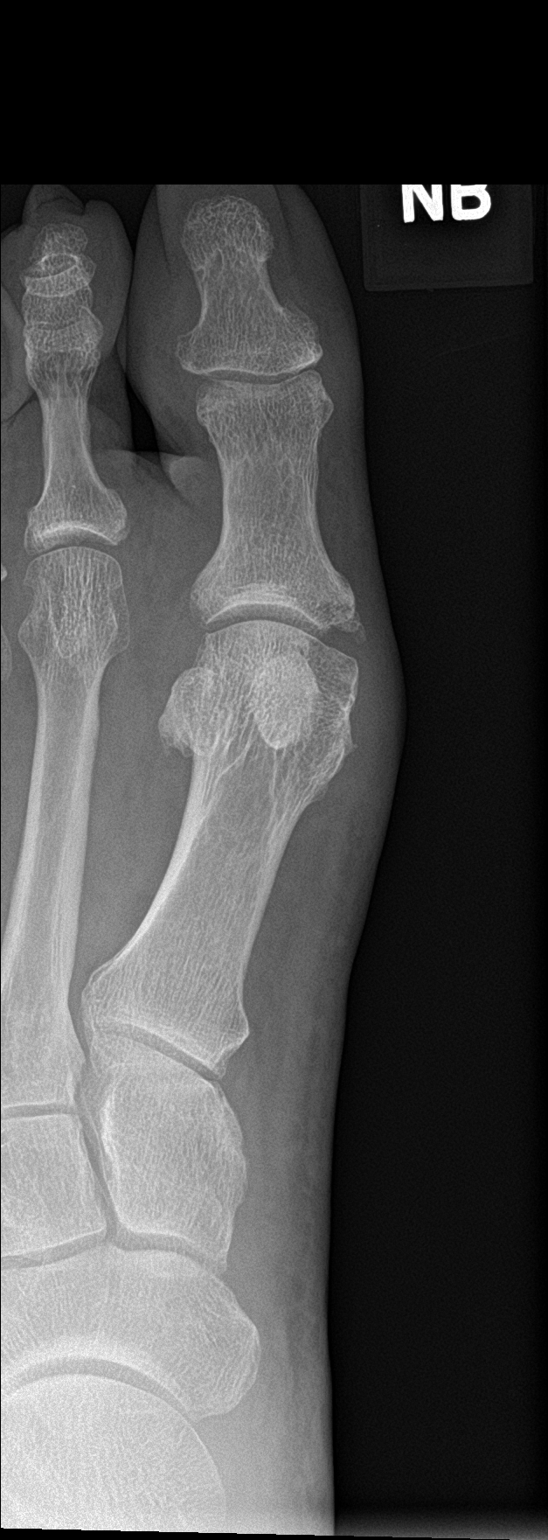

[toe obl]
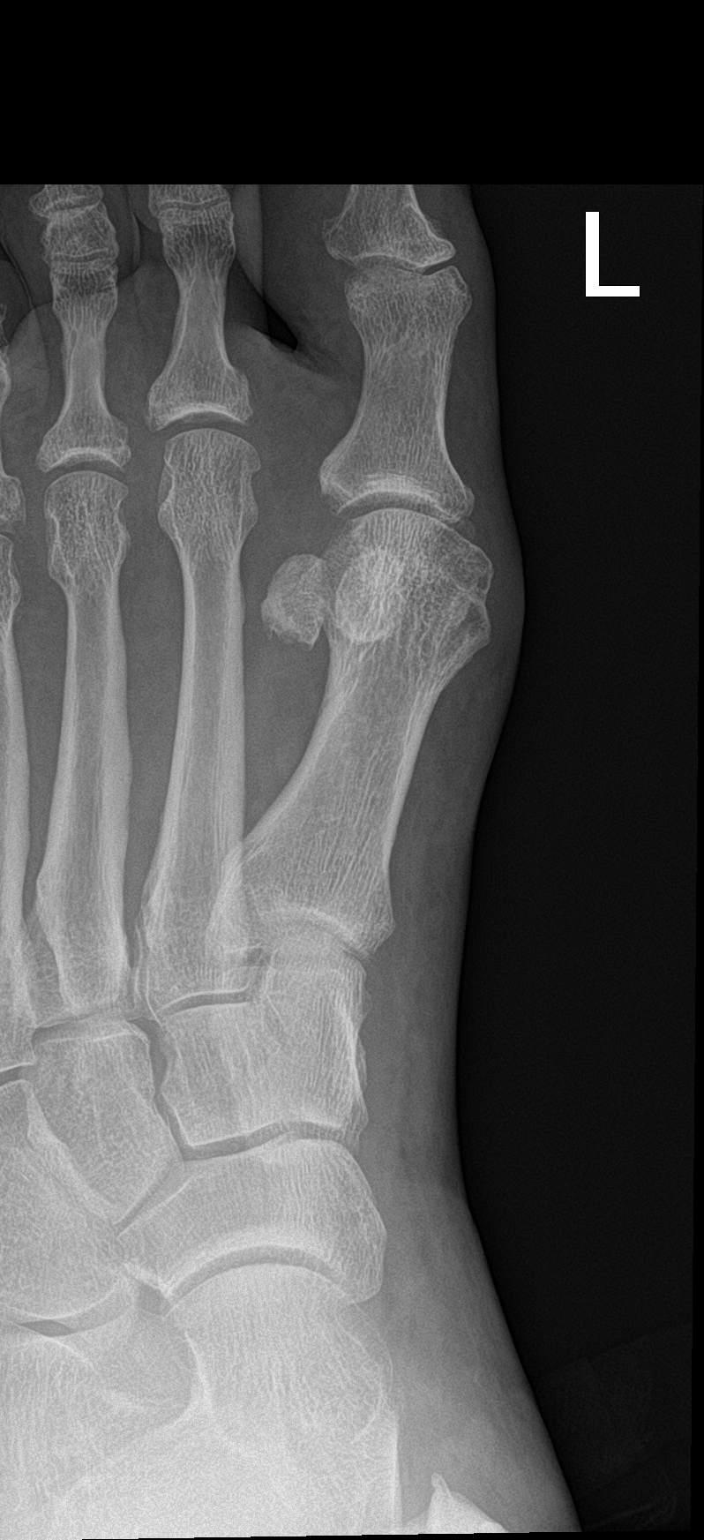

[toe lat]
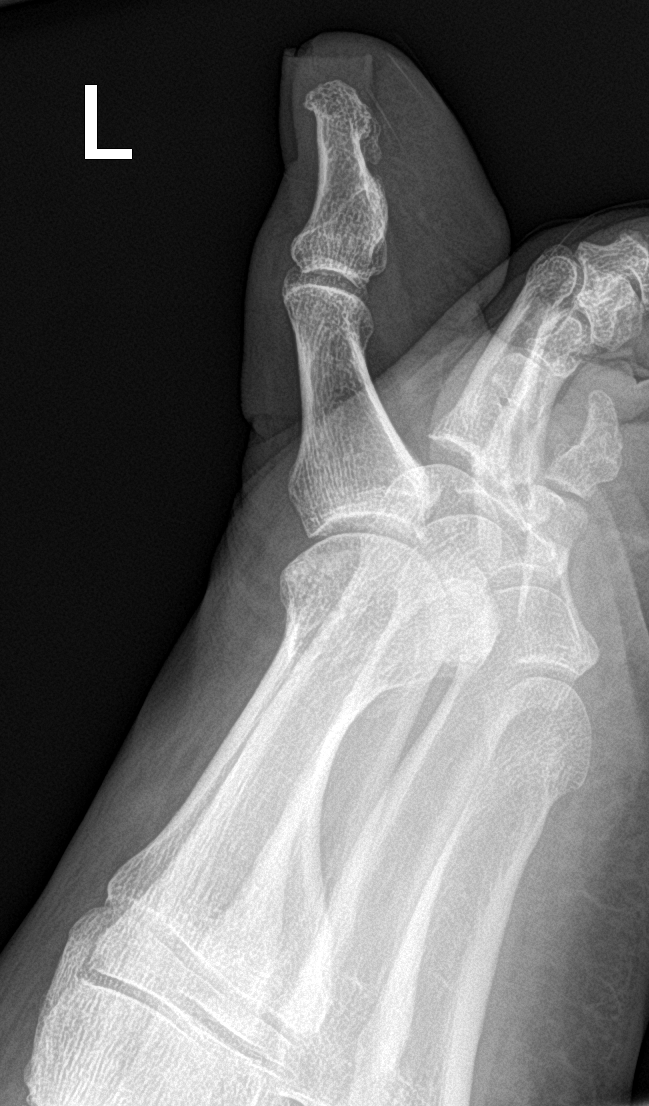

[3 of 3 positions shown; findings below may reference images not displayed]

FINDINGS: There is no acute fracture or dislocation. Mild degenerative changes
of the first MTP joint with minimal thickening of the overlying soft
tissue medially.
IMPRESSION: 1. No acute fracture or dislocation.
2. Mild degenerative changes of the first MTP joint.

## 2023-05-13 ENCOUNTER — Encounter: Payer: Self-pay | Admitting: Family Medicine

## 2023-05-13 ENCOUNTER — Ambulatory Visit (INDEPENDENT_AMBULATORY_CARE_PROVIDER_SITE_OTHER): Payer: Managed Care, Other (non HMO) | Admitting: Family Medicine

## 2023-05-13 ENCOUNTER — Ambulatory Visit: Payer: Managed Care, Other (non HMO)

## 2023-05-13 VITALS — BP 125/87 | HR 68 | Resp 20 | Ht 75.0 in | Wt 294.0 lb

## 2023-05-13 DIAGNOSIS — Z125 Encounter for screening for malignant neoplasm of prostate: Secondary | ICD-10-CM

## 2023-05-13 DIAGNOSIS — R351 Nocturia: Secondary | ICD-10-CM | POA: Diagnosis not present

## 2023-05-13 DIAGNOSIS — R053 Chronic cough: Secondary | ICD-10-CM | POA: Diagnosis not present

## 2023-05-13 DIAGNOSIS — E669 Obesity, unspecified: Secondary | ICD-10-CM

## 2023-05-13 DIAGNOSIS — N529 Male erectile dysfunction, unspecified: Secondary | ICD-10-CM | POA: Diagnosis not present

## 2023-05-13 MED ORDER — SILDENAFIL CITRATE 100 MG PO TABS
100.0000 mg | ORAL_TABLET | ORAL | 0 refills | Status: DC | PRN
Start: 2023-05-13 — End: 2023-12-18

## 2023-05-13 NOTE — Assessment & Plan Note (Signed)
Patient does note some increased nighttime urination.  He says that he has had elevated prostate level in the past.  He is currently on testosterone and has not had his PSA checked in a while we will check today.

## 2023-05-13 NOTE — Assessment & Plan Note (Signed)
Did have a discussion with patient about his weight and whether he would be eligible for GLP-1 injectables.  Patient would like to discuss further with PCP

## 2023-05-13 NOTE — Assessment & Plan Note (Signed)
Patient needs refills, will go ahead and refill

## 2023-05-13 NOTE — Progress Notes (Signed)
Acute Office Visit  Subjective:     Patient ID: Jose Jimenez, male    DOB: 11-Feb-1961, 62 y.o.   MRN: [id number]  Chief Complaint  Patient presents with   Cough    Pt comes in with complaints of cough that has been going on x2 1/2 yrs since he stopped smoking    HPI Patient is in today for cough and congestion for 3 years since stopping smoking pot. He does have a cigarette smoking history as well. Was a past cigarette smoker and stopped smoking 5 years ago.    Review of Systems  Constitutional:  Negative for chills and fever.  Respiratory:  Positive for cough. Negative for shortness of breath.   Cardiovascular:  Negative for chest pain.  Neurological:  Negative for headaches.        Objective:    BP 125/87 (BP Location: Left Arm, Patient Position: Sitting, Cuff Size: Large)   Pulse 68   Resp 20   Ht 6\' 3"  (1.905 m)   Wt 294 lb (133.4 kg)   SpO2 96%   BMI 36.75 kg/m    Physical Exam Vitals and nursing note reviewed.  Constitutional:      General: He is not in acute distress.    Appearance: Normal appearance.  HENT:     Head: Normocephalic and atraumatic.     Right Ear: External ear normal.     Left Ear: External ear normal.     Nose: Nose normal.  Eyes:     Conjunctiva/sclera: Conjunctivae normal.  Cardiovascular:     Rate and Rhythm: Normal rate and regular rhythm.  Pulmonary:     Effort: Pulmonary effort is normal.     Breath sounds: Normal breath sounds.  Neurological:     General: No focal deficit present.     Mental Status: He is alert and oriented to person, place, and time.  Psychiatric:        Mood and Affect: Mood normal.        Behavior: Behavior normal.        Thought Content: Thought content normal.        Judgment: Judgment normal.     No results found for any visits on 05/13/23.      Assessment & Plan:   Problem List Items Addressed This Visit       Other   Obesity    Did have a discussion with patient about his weight and  whether he would be eligible for GLP-1 injectables.  Patient would like to discuss further with PCP      Erectile dysfunction    Patient needs refills, will go ahead and refill      Relevant Medications   sildenafil (VIAGRA) 100 MG tablet   Chronic cough - Primary    Patient notes a history of a cough.  He a recently quit smoking 5 years ago and does have a 20-year smoking history.  We did discuss that he is elderly eligible for CT lung cancer screening however he would like to hold off at this time and discussed with his PCP. - Due to chronic cough I am curious if patient is having some lung disease whether it be restrictive obstructive.  Will go ahead and get a chest x-ray today.  Will also set patient up with pulmonary function test -If chest x-ray comes back negative patient would be eligible for CT of his chest due to chronicity of his cough - Recommended patient take Mucinex to  help with getting up for sputum production      Relevant Orders   DG Chest 2 View   Nocturia    Patient does note some increased nighttime urination.  He says that he has had elevated prostate level in the past.  He is currently on testosterone and has not had his PSA checked in a while we will check today.      Other Visit Diagnoses     Screening for prostate cancer       Relevant Orders   PSA       Meds ordered this encounter  Medications   sildenafil (VIAGRA) 100 MG tablet    Sig: Take 1 tablet (100 mg total) by mouth as needed for erectile dysfunction. TAKE 1/2 TO 1 (ONE-HALF TO ONE) TABLET BY MOUTH ONCE DAILY AS NEEDED FOR ERECTILE DYSFUNCTION    Dispense:  5 tablet    Refill:  0    Return in about 4 weeks (around 06/10/2023) for with PCP.  Charlton Amor, DO

## 2023-05-13 NOTE — Assessment & Plan Note (Addendum)
Patient notes a history of a cough.  He a recently quit smoking 5 years ago and does have a 20-year smoking history.  We did discuss that he is elderly eligible for CT lung cancer screening however he would like to hold off at this time and discussed with his PCP. - Due to chronic cough I am curious if patient is having some lung disease whether it be restrictive obstructive.  Will go ahead and get a chest x-ray today.  Will also set patient up with pulmonary function test -If chest x-ray comes back negative patient would be eligible for CT of his chest due to chronicity of his cough - Recommended patient take Mucinex to help with getting up for sputum production

## 2023-05-14 ENCOUNTER — Other Ambulatory Visit: Payer: Self-pay | Admitting: Family Medicine

## 2023-05-14 DIAGNOSIS — R972 Elevated prostate specific antigen [PSA]: Secondary | ICD-10-CM

## 2023-05-29 ENCOUNTER — Ambulatory Visit (INDEPENDENT_AMBULATORY_CARE_PROVIDER_SITE_OTHER): Payer: Managed Care, Other (non HMO) | Admitting: Urology

## 2023-05-29 ENCOUNTER — Encounter: Payer: Self-pay | Admitting: Urology

## 2023-05-29 VITALS — BP 122/93 | HR 88 | Ht 73.0 in | Wt 290.0 lb

## 2023-05-29 DIAGNOSIS — N138 Other obstructive and reflux uropathy: Secondary | ICD-10-CM

## 2023-05-29 DIAGNOSIS — N401 Enlarged prostate with lower urinary tract symptoms: Secondary | ICD-10-CM | POA: Diagnosis not present

## 2023-05-29 DIAGNOSIS — R972 Elevated prostate specific antigen [PSA]: Secondary | ICD-10-CM | POA: Insufficient documentation

## 2023-05-29 MED ORDER — ALFUZOSIN HCL ER 10 MG PO TB24
10.0000 mg | ORAL_TABLET | Freq: Every day | ORAL | 11 refills | Status: DC
Start: 2023-05-29 — End: 2023-08-15

## 2023-05-29 NOTE — Progress Notes (Signed)
Assessment: 1. Elevated PSA   2. BPH with obstruction/lower urinary tract symptoms     Plan: I personally reviewed the patient's chart including provider notes, and lab results. Today I had a long discussion with the patient regarding PSA and the rationale and controversies of prostate cancer early detection.  I discussed the pros and cons of further evaluation including TRUS and prostate Bx.  Potential adverse events and complications as well as standard instructions were given.  Patient expressed his understanding of these issues. Iso PSA today - will call with results Trial of alfuzosin 10 mg daily for BPH with LUTS  Chief Complaint:  Chief Complaint  Patient presents with   Elevated PSA    History of Present Illness:  Jose Jimenez is a 62 y.o. male who is seen in consultation from Morey Hummingbird, DO  for evaluation of elevated PSA.  PSA results: 7/24 5.1 11/22 3.6 4/21 1.3 6/17 2.97  No history of elevated PSA.  No history of UTIs or prostatitis.  He has a family history of prostate cancer with his father.  He was on testosterone replacement therapy with testosterone injections weekly.  He discontinued this recently.  He he reports lower urinary tract symptoms including frequency, urgency, nocturia 2-3 times, decreased force of stream, and intermittent stream.  No dysuria or gross hematuria. IPSS = 14 QOL = 4 today.  Past Medical History:  Past Medical History:  Diagnosis Date   Arthritis    Hyperlipidemia     Past Surgical History:  Past Surgical History:  Procedure Laterality Date   APPENDECTOMY      Allergies:  No Known Allergies  Family History:  Family History  Problem Relation Age of Onset   Prostate cancer Father     Social History:  Social History   Tobacco Use   Smoking status: Former   Smokeless tobacco: Never  Substance Use Topics   Alcohol use: No   Drug use: No    Review of symptoms:  Constitutional:  Negative for unexplained  weight loss, night sweats, fever, chills ENT:  Negative for nose bleeds, sinus pain, painful swallowing CV:  Negative for chest pain, shortness of breath, exercise intolerance, palpitations, loss of consciousness Resp:  Negative for cough, wheezing, shortness of breath GI:  Negative for nausea, vomiting, diarrhea, bloody stools GU:  Positives noted in HPI; otherwise negative for gross hematuria, dysuria, urinary incontinence Neuro:  Negative for seizures, poor balance, limb weakness, slurred speech Psych:  Negative for lack of energy, depression, anxiety Endocrine:  Negative for polydipsia, polyuria, symptoms of hypoglycemia (dizziness, hunger, sweating) Hematologic:  Negative for anemia, purpura, petechia, prolonged or excessive bleeding, use of anticoagulants  Allergic:  Negative for difficulty breathing or choking as a result of exposure to anything; no shellfish allergy; no allergic response (rash/itch) to materials, foods  Physical exam: BP (!) 122/93   Pulse 88   Ht 6\' 1"  (1.854 m)   Wt 290 lb (131.5 kg)   BMI 38.26 kg/m  GENERAL APPEARANCE:  Well appearing, well developed, well nourished, NAD HEENT: Atraumatic, Normocephalic, oropharynx clear. NECK: Supple without lymphadenopathy or thyromegaly. LUNGS: Clear to auscultation bilaterally. HEART: Regular Rate and Rhythm without murmurs, gallops, or rubs. ABDOMEN: Soft, non-tender, No Masses. EXTREMITIES: Moves all extremities well.  Without clubbing, cyanosis, or edema. NEUROLOGIC:  Alert and oriented x 3, normal gait, CN II-XII grossly intact.  MENTAL STATUS:  Appropriate. BACK:  Non-tender to palpation.  No CVAT SKIN:  Warm, dry and intact.  GU: Penis:  circumcised Meatus: Normal Scrotum: normal, no masses Testis: normal without masses bilateral Epididymis: normal Prostate: 40 g, NT, no nodules Rectum: Normal tone,  no masses or tenderness   Results: U/A:  negative

## 2023-05-30 LAB — URINALYSIS, ROUTINE W REFLEX MICROSCOPIC
Bilirubin, UA: NEGATIVE
Glucose, UA: NEGATIVE
Ketones, UA: NEGATIVE
Leukocytes,UA: NEGATIVE
Nitrite, UA: NEGATIVE
Protein,UA: NEGATIVE
RBC, UA: NEGATIVE
Specific Gravity, UA: 1.03 (ref 1.005–1.030)
Urobilinogen, Ur: 2 mg/dL — ABNORMAL HIGH (ref 0.2–1.0)
pH, UA: 5.5 (ref 5.0–7.5)

## 2023-06-03 ENCOUNTER — Encounter: Payer: Self-pay | Admitting: Urology

## 2023-06-05 ENCOUNTER — Telehealth: Payer: Self-pay | Admitting: Urology

## 2023-06-05 ENCOUNTER — Other Ambulatory Visit: Payer: Self-pay

## 2023-06-05 DIAGNOSIS — R972 Elevated prostate specific antigen [PSA]: Secondary | ICD-10-CM

## 2023-06-05 NOTE — Telephone Encounter (Signed)
Per Dr Pete Glatter - Please contact patient to schedule a prostate biopsy.LVM

## 2023-06-13 ENCOUNTER — Encounter: Payer: Self-pay | Admitting: Sports Medicine

## 2023-06-13 ENCOUNTER — Ambulatory Visit (INDEPENDENT_AMBULATORY_CARE_PROVIDER_SITE_OTHER): Payer: Managed Care, Other (non HMO) | Admitting: Sports Medicine

## 2023-06-13 VITALS — BP 120/81 | HR 100 | Ht 73.0 in | Wt 289.0 lb

## 2023-06-13 DIAGNOSIS — L603 Nail dystrophy: Secondary | ICD-10-CM | POA: Diagnosis not present

## 2023-06-13 DIAGNOSIS — R972 Elevated prostate specific antigen [PSA]: Secondary | ICD-10-CM

## 2023-06-13 DIAGNOSIS — E785 Hyperlipidemia, unspecified: Secondary | ICD-10-CM

## 2023-06-13 MED ORDER — ROSUVASTATIN CALCIUM 10 MG PO TABS
10.0000 mg | ORAL_TABLET | Freq: Every day | ORAL | 3 refills | Status: AC
Start: 2023-06-13 — End: ?

## 2023-06-13 MED ORDER — TERBINAFINE HCL 250 MG PO TABS: 250.0000 mg | ORAL_TABLET | Freq: Every day | ORAL | 3 refills | Status: DC

## 2023-06-13 NOTE — Assessment & Plan Note (Signed)
Refilling rosuvastatin

## 2023-06-13 NOTE — Assessment & Plan Note (Signed)
Refractory onychodystrophy. He did note some improvement with Lamisil in the distant past, we will restart this and I would like him to touch base with podiatry.

## 2023-06-13 NOTE — Progress Notes (Signed)
    Procedures performed today:    None.  Independent interpretation of notes and tests performed by another provider:   None.  Brief History, Exam, Impression, and Recommendations:    Hyperlipidemia Refilling rosuvastatin  Onychodystrophy likely secondary to onychomycosis Refractory onychodystrophy. He did note some improvement with Lamisil in the distant past, we will restart this and I would like him to touch base with podiatry.  Elevated PSA Elevated PSA, established with urology, prostate biopsy coming up.  Chronic process not at goal with pharmacologic intervention  ____________________________________________ Ihor Austin. Benjamin Stain, M.D., ABFM., CAQSM., AME. Primary Care and Sports Medicine [facility]  Adjunct Professor of Family Medicine  [employer]  Restaurant manager, fast food

## 2023-06-13 NOTE — Assessment & Plan Note (Signed)
Elevated PSA, established with urology, prostate biopsy coming up.

## 2023-07-24 ENCOUNTER — Ambulatory Visit: Payer: Managed Care, Other (non HMO) | Admitting: Urology

## 2023-07-24 ENCOUNTER — Ambulatory Visit (HOSPITAL_BASED_OUTPATIENT_CLINIC_OR_DEPARTMENT_OTHER)
Admission: RE | Admit: 2023-07-24 | Discharge: 2023-07-24 | Disposition: A | Discharge status: Home / Self Care | Payer: Managed Care, Other (non HMO) | Source: Ambulatory Visit | Attending: Urology | Admitting: Urology

## 2023-07-24 ENCOUNTER — Other Ambulatory Visit (HOSPITAL_BASED_OUTPATIENT_CLINIC_OR_DEPARTMENT_OTHER): Payer: Self-pay | Admitting: Urology

## 2023-07-24 ENCOUNTER — Encounter: Payer: Self-pay | Admitting: Urology

## 2023-07-24 VITALS — BP 122/81 | HR 86 | Ht 74.0 in | Wt 290.0 lb

## 2023-07-24 DIAGNOSIS — N4231 Prostatic intraepithelial neoplasia: Secondary | ICD-10-CM | POA: Diagnosis not present

## 2023-07-24 DIAGNOSIS — Z2989 Encounter for other specified prophylactic measures: Secondary | ICD-10-CM | POA: Diagnosis not present

## 2023-07-24 DIAGNOSIS — N401 Enlarged prostate with lower urinary tract symptoms: Secondary | ICD-10-CM

## 2023-07-24 DIAGNOSIS — R972 Elevated prostate specific antigen [PSA]: Secondary | ICD-10-CM

## 2023-07-24 MED ORDER — CEFTRIAXONE SODIUM 1 G IJ SOLR
1.0000 g | Freq: Once | INTRAMUSCULAR | Status: AC
Start: 2023-07-24 — End: 2023-07-24
  Administered 2023-07-24: 1 g via INTRAMUSCULAR

## 2023-07-24 NOTE — Progress Notes (Signed)
Assessment: 1. Elevated PSA   2. BPH with obstruction/lower urinary tract symptoms     Plan: Post biopsy instructions given Continue alfuzosin 10 mg daily for BPH with LUTS Return to office in 7-10 days for biopsy results.  Chief Complaint:  Chief Complaint  Patient presents with   Prostate Biopsy    History of Present Illness:  Jose Jimenez is a 62 y.o. male who is seen for further evaluation of elevated PSA.  PSA results: 7/24 5.1 11/22 3.6 4/21 1.3 6/17 2.97  Iso PSA 8/24:  4.79 with isoPSA of 7.8.  No history of elevated PSA.  No history of UTIs or prostatitis.  He has a family history of prostate cancer with his father.  He was on testosterone replacement therapy with testosterone injections weekly.  He discontinued this recently.  He he reported lower urinary tract symptoms including frequency, urgency, nocturia 2-3 times, decreased force of stream, and intermittent stream.  No dysuria or gross hematuria. IPSS = 14 QOL = 4. He was given a trial of alfuzosin at his visit in 8/24.  He presents today for further evaluation with a prostate biopsy. He continues on alfuzosin.  His LUTS are stable.  No dysuria or gross hematuria.  Portions of the above documentation were copied from a prior visit for review purposes only.   Past Medical History:  Past Medical History:  Diagnosis Date   Arthritis    Hyperlipidemia     Past Surgical History:  Past Surgical History:  Procedure Laterality Date   APPENDECTOMY      Allergies:  No Known Allergies  Family History:  Family History  Problem Relation Age of Onset   Prostate cancer Father     Social History:  Social History   Tobacco Use   Smoking status: Former   Smokeless tobacco: Never  Substance Use Topics   Alcohol use: No   Drug use: No    ROS: Constitutional:  Negative for fever, chills, weight loss CV: Negative for chest pain, previous MI, hypertension Respiratory:  Negative for shortness  of breath, wheezing, sleep apnea, frequent cough GI:  Negative for nausea, vomiting, bloody stool, GERD  Physical exam: BP 122/81   Pulse 86   Ht 6\' 2"  (1.88 m)   Wt 290 lb (131.5 kg)   BMI 37.23 kg/m  GENERAL APPEARANCE:  Well appearing, well developed, well nourished, NAD HEENT:  Atraumatic, normocephalic, oropharynx clear NECK:  Supple without lymphadenopathy or thyromegaly ABDOMEN:  Soft, non-tender, no masses EXTREMITIES:  Moves all extremities well, without clubbing, cyanosis, or edema NEUROLOGIC:  Alert and oriented x 3, normal gait, CN II-XII grossly intact MENTAL STATUS:  appropriate BACK:  Non-tender to palpation, No CVAT SKIN:  Warm, dry, and intact   Results: U/A: negative  TRANSRECTAL ULTRASOUND AND PROSTATE BIOPSY  Indication:  Elevated PSA  Prophylactic antibiotic administration: Rocephin  All medications that could result in increased bleeding were discontinued within an appropriate period of the time of biopsy.  Risk including bleeding and infection were discussed.  Informed consent was obtained.  The patient was placed in the left lateral decubitus position.  PROCEDURE 1.  TRANSRECTAL ULTRASOUND OF THE PROSTATE  The 7 MHz transrectal probe was used to image the prostate.  Anal stenosis was not noted.  TRUS volume: 73.5 ml  Hypoechoic areas: None  Hyperechoic areas: None  Central calcifications: not present  Margins:  normal  Seminal Vesicles: normal   PROCEDURE 2:  PROSTATE BIOPSY  A periprostatic block was  performed using 1% lidocaine and transrectal ultrasound guidance. Under transrectal ultrasound guidance, and using the Biopty gun, prostate biopsies were obtained systematically from the apex, mid gland, and base bilaterally.  A total of 12 cores were obtained.  Hemostasis was obtained with gentle pressure on the prostate.  The procedures were well-tolerated.  No significant bleeding was noted at the end of the procedure.  The patient was  stable for discharge from the office.

## 2023-07-24 NOTE — Progress Notes (Signed)
IM Injection  Patient is present today for an IM Injection for treatment of infection prevention post prostate biopsy Drug: Ceftriaxone Dose:1g Location:Right upper outer buttocks Lot: [id number] Exp:04/2025 Patient tolerated well, no complications were noted  Performed by: Arville Go CMA

## 2023-07-25 LAB — URINALYSIS, ROUTINE W REFLEX MICROSCOPIC
Bilirubin, UA: NEGATIVE
Glucose, UA: NEGATIVE
Ketones, UA: NEGATIVE
Leukocytes,UA: NEGATIVE
Nitrite, UA: NEGATIVE
Protein,UA: NEGATIVE
RBC, UA: NEGATIVE
Specific Gravity, UA: >1.03 — ABNORMAL HIGH (ref 1.005–1.030)
Urobilinogen, Ur: 2 mg/dL — ABNORMAL HIGH (ref 0.2–1.0)
pH, UA: 5.5 (ref 5.0–7.5)

## 2023-07-26 ENCOUNTER — Encounter: Payer: Self-pay | Admitting: Urology

## 2023-07-30 ENCOUNTER — Telehealth: Payer: Self-pay | Admitting: Urology

## 2023-07-30 NOTE — Telephone Encounter (Signed)
Patient called with biopsy results today.   Biopsy showed evidence of chronic inflammation and PIN without evidence of malignancy. He doing well after the biopsy.  No significant bleeding. I discussed the biopsy results with the patient today.  Given the negative biopsy I recommended follow-up in 6 months.  He is doing well on alfuzosin.  I recommended he continue this.

## 2023-07-31 ENCOUNTER — Ambulatory Visit: Payer: Managed Care, Other (non HMO) | Admitting: Urology

## 2023-08-15 ENCOUNTER — Other Ambulatory Visit: Payer: Self-pay

## 2023-08-15 DIAGNOSIS — N401 Enlarged prostate with lower urinary tract symptoms: Secondary | ICD-10-CM

## 2023-08-15 MED ORDER — ALFUZOSIN HCL ER 10 MG PO TB24: 10.0000 mg | ORAL_TABLET | Freq: Every day | ORAL | 11 refills | Status: DC

## 2023-10-21 ENCOUNTER — Encounter: Payer: Self-pay | Admitting: Sports Medicine

## 2023-10-31 ENCOUNTER — Ambulatory Visit (INDEPENDENT_AMBULATORY_CARE_PROVIDER_SITE_OTHER): Payer: Managed Care, Other (non HMO) | Admitting: Podiatry

## 2023-10-31 DIAGNOSIS — L603 Nail dystrophy: Secondary | ICD-10-CM | POA: Diagnosis not present

## 2023-10-31 NOTE — Progress Notes (Signed)
  Subjective:  Patient ID: Jose Jimenez, male    DOB: Apr 02, 1961,   MRN: [id number]  No chief complaint on file.   63 y.o. male presents for concern of fungal nails. Relates particularly right great toe nail. This has been present for about two years. He has been on lamisil in the past and did get some improvement. Referred here from Dr. Karie Schwalbe and has been started on lamisil but took it for a month and then stopped.  Also some concern for some tingling in his feet. Denies nunbness burning or pain. Denies any other pedal complaints. Denies n/v/f/c.   Past Medical History:  Diagnosis Date   Arthritis    Hyperlipidemia     Objective:  Physical Exam: Vascular: DP/PT pulses 2/4 bilateral. CFT <3 seconds. Normal hair growth on digits. No edema.  Skin. No lacerations or abrasions bilateral feet. Right hallux nail thickened and dsytrophic and white yellow discoloration with brittleness noted throughout the nail Musculoskeletal: MMT 5/5 bilateral lower extremities in DF, PF, Inversion and Eversion. Deceased ROM in DF of ankle joint.  Neurological: Sensation intact to light touch.   Assessment:   1. Onychodystrophy likely secondary to onychomycosis      Plan:  Patient was evaluated and treated and all questions answered. -Examined patient -Discussed treatment options for painful dystrophic nails  -Clinical picture and Fungal culture was obtained by removing a portion of the hard nail itself from each of the involved toenails using a sterile nail nipper and sent to [facility] lab. Patient tolerated the biopsy procedure well without discomfort or need for anesthesia.  -Discussed fungal nail treatment options including oral, topical, and laser treatments.  -Patient to return in 4 weeks for follow up evaluation and discussion of fungal culture results or sooner if symptoms worsen.   Louann Sjogren, DPM

## 2023-10-31 NOTE — Addendum Note (Signed)
Addended by: Daryel November on: 10/31/2023 04:25 PM   Modules accepted: Orders

## 2023-11-12 ENCOUNTER — Other Ambulatory Visit: Payer: Self-pay | Admitting: Podiatry

## 2023-11-22 ENCOUNTER — Ambulatory Visit (INDEPENDENT_AMBULATORY_CARE_PROVIDER_SITE_OTHER): Payer: Managed Care, Other (non HMO) | Admitting: Podiatry

## 2023-11-22 DIAGNOSIS — L603 Nail dystrophy: Secondary | ICD-10-CM

## 2023-11-22 MED ORDER — TERBINAFINE HCL 250 MG PO TABS: 250.0000 mg | ORAL_TABLET | Freq: Every day | ORAL | 2 refills | Status: DC

## 2023-11-22 NOTE — Progress Notes (Signed)
  Subjective:  Patient ID: Jose Jimenez, male    DOB: 07-16-1961,   MRN: [id number]  Chief Complaint  Patient presents with   Nail Problem    Patient returns for a follow up evaluation and discussion of fungal culture results.    63 y.o. male presents for follow-up of fungal nails and to review culture results.  Denies any other pedal complaints. Denies n/v/f/c.   Past Medical History:  Diagnosis Date   Arthritis    Hyperlipidemia     Objective:  Physical Exam: Vascular: DP/PT pulses 2/4 bilateral. CFT <3 seconds. Normal hair growth on digits. No edema.  Skin. No lacerations or abrasions bilateral feet. Right hallux nail thickened and dsytrophic and white yellow discoloration with brittleness noted throughout the nail Musculoskeletal: MMT 5/5 bilateral lower extremities in DF, PF, Inversion and Eversion. Deceased ROM in DF of ankle joint.  Neurological: Sensation intact to light touch.   Assessment:   1. Onychodystrophy likely secondary to onychomycosis       Plan:  Patient was evaluated and treated and all questions answered. -Examined patient -Discussed treatment options for painful dystrophic nails  -Culture is positive for fungus and also some trauma to the nail -Discussed fungal nail treatment options including oral, topical, and laser treatments. -Lamisil  sent to pharmacy x90 days   -Patient to return in 3 months for recheck   Asberry Failing, DPM

## 2023-12-16 ENCOUNTER — Other Ambulatory Visit: Payer: Self-pay | Admitting: Family Medicine

## 2023-12-16 DIAGNOSIS — N529 Male erectile dysfunction, unspecified: Secondary | ICD-10-CM

## 2024-01-06 ENCOUNTER — Encounter (INDEPENDENT_AMBULATORY_CARE_PROVIDER_SITE_OTHER): Payer: Self-pay | Admitting: Sports Medicine

## 2024-01-06 DIAGNOSIS — R351 Nocturia: Secondary | ICD-10-CM | POA: Diagnosis not present

## 2024-01-06 NOTE — Telephone Encounter (Signed)

## 2024-01-13 ENCOUNTER — Telehealth: Payer: Self-pay | Admitting: Urology

## 2024-01-13 NOTE — Telephone Encounter (Signed)
 Pt called and lvm about upcoming appointment I called pt back and lvm 01/13/24 ANN

## 2024-01-15 ENCOUNTER — Ambulatory Visit: Payer: Managed Care, Other (non HMO) | Admitting: Urology

## 2024-01-16 ENCOUNTER — Ambulatory Visit: Payer: Managed Care, Other (non HMO) | Admitting: Urology

## 2024-02-10 ENCOUNTER — Encounter: Payer: Self-pay | Admitting: Urology

## 2024-02-10 ENCOUNTER — Ambulatory Visit (INDEPENDENT_AMBULATORY_CARE_PROVIDER_SITE_OTHER): Admitting: Urology

## 2024-02-10 VITALS — BP 121/81 | HR 68 | Ht 73.0 in | Wt 289.0 lb

## 2024-02-10 DIAGNOSIS — N401 Enlarged prostate with lower urinary tract symptoms: Secondary | ICD-10-CM

## 2024-02-10 DIAGNOSIS — R972 Elevated prostate specific antigen [PSA]: Secondary | ICD-10-CM

## 2024-02-10 DIAGNOSIS — N138 Other obstructive and reflux uropathy: Secondary | ICD-10-CM | POA: Diagnosis not present

## 2024-02-10 LAB — URINALYSIS, ROUTINE W REFLEX MICROSCOPIC
Bilirubin, UA: NEGATIVE
Glucose, UA: NEGATIVE
Ketones, UA: NEGATIVE
Leukocytes,UA: NEGATIVE
Nitrite, UA: NEGATIVE
Protein,UA: NEGATIVE
RBC, UA: NEGATIVE
Specific Gravity, UA: 1.02 (ref 1.005–1.030)
Urobilinogen, Ur: 1 mg/dL (ref 0.2–1.0)
pH, UA: 5.5 (ref 5.0–7.5)

## 2024-02-10 MED ORDER — FINASTERIDE 5 MG PO TABS
5.0000 mg | ORAL_TABLET | Freq: Every day | ORAL | 11 refills | Status: AC
Start: 2024-02-10 — End: ?

## 2024-02-10 NOTE — Progress Notes (Signed)
 Assessment: 1. Elevated PSA; negative biopsy 10/24   2. BPH with obstruction/lower urinary tract symptoms      Plan: 4K test sent today. Continue alfuzosin  10 mg daily for BPH with LUTS.  I recommended that he take this once a day as prescribed. We discussed combination therapy with a 5 alpha reductase inhibitor such as finasteride or dutasteride.  I discussed the potential benefits of this additional medication as far as decreasing the prostate volume.  I advised him that this is a long-term medication.  We discussed potential side effects.  He is interested in trying this. Prescription for finasteride 5 mg daily sent. Return to office in 6 months  Chief Complaint:  Chief Complaint  Patient presents with   Elevated PSA    History of Present Illness:  Jose Jimenez is a 63 y.o. male who is seen for further evaluation of elevated PSA and BPH with lower urinary tract symptoms.  PSA results: 7/24 5.1 11/22 3.6 4/21 1.3 6/17 2.97  Iso PSA 8/24:  4.79 with isoPSA of 7.8.  No history of elevated PSA.  No history of UTIs or prostatitis.  He has a family history of prostate cancer with his father.  He was on testosterone  replacement therapy with testosterone  injections weekly.  He discontinued this due to to his PSA increase.  He he reported lower urinary tract symptoms including frequency, urgency, nocturia 2-3 times, decreased force of stream, and intermittent stream.  No dysuria or gross hematuria. IPSS = 14 QOL = 4. He was given a trial of alfuzosin  at his visit in 8/24.  He underwent a transrectal ultrasound and biopsy of the prostate on 07/24/2023. TRUS volume:  73.5 ml  Biopsy results: Benign prostate tissue with chronic inflammation; high-grade PIN in 1 core.  He returns today for follow-up.  He continues on alfuzosin  for his BPH with lower urinary tract symptoms.  He has actually been taking this twice a day for unknown reasons.  He reports that his urinary symptoms are  fairly stable.  He continues to have some frequency and intermittent stream as well as a decreased force of stream.  No dysuria or gross hematuria.   Portions of the above documentation were copied from a prior visit for review purposes only.   Past Medical History:  Past Medical History:  Diagnosis Date   Arthritis    Hyperlipidemia     Past Surgical History:  Past Surgical History:  Procedure Laterality Date   APPENDECTOMY      Allergies:  No Known Allergies  Family History:  Family History  Problem Relation Age of Onset   Prostate cancer Father     Social History:  Social History   Tobacco Use   Smoking status: Former   Smokeless tobacco: Never  Substance Use Topics   Alcohol use: No   Drug use: No    ROS: Constitutional:  Negative for fever, chills, weight loss CV: Negative for chest pain, previous MI, hypertension Respiratory:  Negative for shortness of breath, wheezing, sleep apnea, frequent cough GI:  Negative for nausea, vomiting, bloody stool, GERD  Physical exam: BP 121/81   Pulse 68   Ht 6\' 1"  (1.854 m)   Wt 289 lb (131.1 kg)   BMI 38.13 kg/m  GENERAL APPEARANCE:  Well appearing, well developed, well nourished, NAD HEENT:  Atraumatic, normocephalic, oropharynx clear NECK:  Supple without lymphadenopathy or thyromegaly ABDOMEN:  Soft, non-tender, no masses EXTREMITIES:  Moves all extremities well, without clubbing, cyanosis, or edema  NEUROLOGIC:  Alert and oriented x 3, normal gait, CN II-XII grossly intact MENTAL STATUS:  appropriate BACK:  Non-tender to palpation, No CVAT SKIN:  Warm, dry, and intact   Results: U/A: Negative

## 2024-02-12 ENCOUNTER — Encounter: Payer: Self-pay | Admitting: Urology

## 2024-02-13 ENCOUNTER — Encounter: Payer: Self-pay | Admitting: Urology

## 2024-02-17 ENCOUNTER — Encounter: Payer: Self-pay | Admitting: Urology

## 2024-02-20 ENCOUNTER — Encounter (HOSPITAL_COMMUNITY): Payer: Self-pay

## 2024-02-27 ENCOUNTER — Ambulatory Visit (INDEPENDENT_AMBULATORY_CARE_PROVIDER_SITE_OTHER): Admitting: Sports Medicine

## 2024-02-27 ENCOUNTER — Encounter: Payer: Self-pay | Admitting: Sports Medicine

## 2024-02-27 VITALS — BP 118/82 | HR 69 | Wt 292.0 lb

## 2024-02-27 DIAGNOSIS — E669 Obesity, unspecified: Secondary | ICD-10-CM

## 2024-02-27 DIAGNOSIS — R600 Localized edema: Secondary | ICD-10-CM | POA: Diagnosis not present

## 2024-02-27 MED ORDER — ZEPBOUND 2.5 MG/0.5ML ~~LOC~~ SOAJ
2.5000 mg | SUBCUTANEOUS | 0 refills | Status: DC
Start: 2024-02-27 — End: 2024-06-11

## 2024-02-27 MED ORDER — ONDANSETRON 8 MG PO TBDP
8.0000 mg | ORAL_TABLET | Freq: Three times a day (TID) | ORAL | 3 refills | Status: AC | PRN
Start: 2024-02-27 — End: ?

## 2024-02-27 MED ORDER — FUROSEMIDE 20 MG PO TABS
20.0000 mg | ORAL_TABLET | Freq: Every day | ORAL | 0 refills | Status: DC
Start: 2024-02-27 — End: 2024-06-11

## 2024-02-27 MED ORDER — SPIRONOLACTONE 25 MG PO TABS
25.0000 mg | ORAL_TABLET | Freq: Every day | ORAL | 0 refills | Status: DC
Start: 2024-02-27 — End: 2024-06-11

## 2024-02-27 NOTE — Assessment & Plan Note (Signed)
 Increasing lower extremity edema, 2+ pitting, symmetric with negative Homans' sign bilaterally, no history of cardiovascular, hepatic or renal disease. He will do furosemide and spironolactone for 2 weeks and get lower extremity compression stockings.

## 2024-02-27 NOTE — Progress Notes (Signed)
    Procedures performed today:    None.  Independent interpretation of notes and tests performed by another provider:   None.  Brief History, Exam, Impression, and Recommendations:    Bilateral lower extremity edema Increasing lower extremity edema, 2+ pitting, symmetric with negative Celine Collard' sign bilaterally, no history of cardiovascular, hepatic or renal disease. He will do furosemide and spironolactone for 2 weeks and get lower extremity compression stockings.  Obesity Obesity, he will be enrolled in a multidisciplinary weight loss program with calorie counting, and exercise prescription, he has no contraindications to GLP-1s, he will not be on any other weight loss medication. We will start Zepbound, he understands if Zepbound or Wegovy are not on formulary we will need to use compounded tirzepatide. Return to see me after 1 month of treatment to evaluate tolerance.    ____________________________________________ Joselyn Nicely. Sandy Crumb, M.D., ABFM., CAQSM., AME. Primary Care and Sports Medicine [facility]  Adjunct Professor of [facility] Medicine  [employer]  Restaurant manager, fast food

## 2024-02-27 NOTE — Assessment & Plan Note (Signed)
 Obesity, he will be enrolled in a multidisciplinary weight loss program with calorie counting, and exercise prescription, he has no contraindications to GLP-1s, he will not be on any other weight loss medication. We will start Zepbound, he understands if Zepbound or Wegovy are not on formulary we will need to use compounded tirzepatide. Return to see me after 1 month of treatment to evaluate tolerance.

## 2024-02-27 NOTE — Telephone Encounter (Signed)
This has been filled out and given to the patient.

## 2024-02-28 ENCOUNTER — Other Ambulatory Visit (HOSPITAL_COMMUNITY): Payer: Self-pay

## 2024-02-28 ENCOUNTER — Telehealth: Payer: Self-pay

## 2024-02-28 NOTE — Telephone Encounter (Signed)
 Pharmacy Patient Advocate Encounter   Received notification from Onbase that prior authorization for Zepbound 2.5MG /0.5ML pen-injectors is required/requested.   Insurance verification completed.   The patient is insured through [employer] .   Per test claim: PA required; PA submitted to above mentioned insurance via CoverMyMeds Key/confirmation #/EOC [id number] Status is pending

## 2024-03-10 ENCOUNTER — Other Ambulatory Visit (HOSPITAL_COMMUNITY): Payer: Self-pay

## 2024-03-10 NOTE — Telephone Encounter (Signed)
 Pharmacy Patient Advocate Encounter  Received notification from [facility] that Prior Authorization for  Zepbound  2.5MG /0.5ML pen-injectors  has been APPROVED from 03/07/24 to 09/07/24. Unable to obtain price due to refill too soon rejection, last fill date 03/07/24 next available fill date06/06/25   PA #/Case ID/Reference #: [id number]

## 2024-04-22 ENCOUNTER — Other Ambulatory Visit: Payer: Self-pay | Admitting: Sports Medicine

## 2024-04-22 DIAGNOSIS — N529 Male erectile dysfunction, unspecified: Secondary | ICD-10-CM

## 2024-06-09 ENCOUNTER — Ambulatory Visit: Admitting: Urgent Care

## 2024-06-09 ENCOUNTER — Ambulatory Visit: Admitting: Sports Medicine

## 2024-06-11 ENCOUNTER — Ambulatory Visit (INDEPENDENT_AMBULATORY_CARE_PROVIDER_SITE_OTHER): Admitting: Urgent Care

## 2024-06-11 ENCOUNTER — Encounter: Payer: Self-pay | Admitting: Urgent Care

## 2024-06-11 VITALS — BP 113/76 | HR 68 | Resp 16 | Ht 73.0 in | Wt 296.5 lb

## 2024-06-11 DIAGNOSIS — B353 Tinea pedis: Secondary | ICD-10-CM

## 2024-06-11 DIAGNOSIS — E669 Obesity, unspecified: Secondary | ICD-10-CM

## 2024-06-11 DIAGNOSIS — N529 Male erectile dysfunction, unspecified: Secondary | ICD-10-CM

## 2024-06-11 DIAGNOSIS — G4730 Sleep apnea, unspecified: Secondary | ICD-10-CM

## 2024-06-11 DIAGNOSIS — M79609 Pain in unspecified limb: Secondary | ICD-10-CM

## 2024-06-11 DIAGNOSIS — E785 Hyperlipidemia, unspecified: Secondary | ICD-10-CM

## 2024-06-11 DIAGNOSIS — R600 Localized edema: Secondary | ICD-10-CM

## 2024-06-11 DIAGNOSIS — R202 Paresthesia of skin: Secondary | ICD-10-CM

## 2024-06-11 MED ORDER — ZEPBOUND 2.5 MG/0.5ML ~~LOC~~ SOAJ: 2.5000 mg | SUBCUTANEOUS | 0 refills | Status: DC

## 2024-06-11 NOTE — Progress Notes (Unsigned)
   Established Patient Office Visit  Subjective:  Patient ID: Jose Jimenez, male    DOB: July 17, 1961  Age: 63 y.o. MRN: [id number]  Chief Complaint  Patient presents with   Medical Management of Chronic Issues    Pt is here to f/u on LE swelling and tingling he does wear his compression socks but not as much as he could he states. Pt also never picked up his zepbound     HPI  {History (Optional):23778}  ROS: as noted in HPI  Objective:     BP 113/76   Pulse 68   Resp 16   Ht 6' 1 (1.854 m)   Wt 296 lb 8 oz (134.5 kg)   SpO2 97%   BMI 39.12 kg/m  {Vitals History (Optional):23777}  Physical Exam   No results found for any visits on 06/11/24.  {Labs (Optional):23779}  The 10-year ASCVD risk score (Arnett DK, et al., 2019) is: 9.6%  Assessment & Plan:  Paresthesia and pain of extremity  Obesity, unspecified class, unspecified obesity type, unspecified whether serious comorbidity present  Erectile dysfunction, unspecified erectile dysfunction type  Bilateral lower extremity edema  Hyperlipidemia, unspecified hyperlipidemia type     No follow-ups on file.   Benton LITTIE Gave, PA

## 2024-06-11 NOTE — Patient Instructions (Addendum)
 We have ordered a home sleep study from [facility]. They should contact you, however if you have not heard anything within 5 business days, you can contact them directly at [phone].  Please start zepbound  - 2.5mg  once weekly injection.  We drew your labs today.  Please follow up in 4-6 weeks.

## 2024-06-12 ENCOUNTER — Encounter: Payer: Self-pay | Admitting: Urgent Care

## 2024-06-12 ENCOUNTER — Ambulatory Visit: Payer: Self-pay | Admitting: Urgent Care

## 2024-06-16 ENCOUNTER — Encounter: Payer: Self-pay | Admitting: Sports Medicine

## 2024-06-16 LAB — CMP14+EGFR
ALT: 17 IU/L (ref 0–44)
AST: 15 IU/L (ref 0–40)
Albumin: 4 g/dL (ref 3.9–4.9)
Alkaline Phosphatase: 75 IU/L (ref 44–121)
BUN/Creatinine Ratio: 14 (ref 10–24)
BUN: 12 mg/dL (ref 8–27)
Bilirubin Total: 0.5 mg/dL (ref 0.0–1.2)
CO2: 20 mmol/L (ref 20–29)
Calcium: 8.8 mg/dL (ref 8.6–10.2)
Chloride: 103 mmol/L (ref 96–106)
Creatinine, Ser: 0.83 mg/dL (ref 0.76–1.27)
Globulin, Total: 2 g/dL (ref 1.5–4.5)
Glucose: 98 mg/dL (ref 70–99)
Potassium: 4.3 mmol/L (ref 3.5–5.2)
Sodium: 136 mmol/L (ref 134–144)
Total Protein: 6 g/dL (ref 6.0–8.5)
eGFR: 98 mL/min/1.73 (ref >59)

## 2024-06-16 LAB — B12 AND FOLATE PANEL
Folate: 10.6 ng/mL (ref >3.0)
Vitamin B-12: 458 pg/mL (ref 232–1245)

## 2024-06-16 LAB — LIPID PANEL
Chol/HDL Ratio: 3.9 ratio (ref 0.0–5.0)
Cholesterol, Total: 140 mg/dL (ref 100–199)
HDL: 36 mg/dL — ABNORMAL LOW (ref >39)
LDL Chol Calc (NIH): 81 mg/dL (ref 0–99)
Triglycerides: 128 mg/dL (ref 0–149)
VLDL Cholesterol Cal: 23 mg/dL (ref 5–40)

## 2024-06-16 LAB — CBC WITH DIFFERENTIAL/PLATELET
Basophils Absolute: 0.1 x10E3/uL (ref 0.0–0.2)
Basos: 1 %
EOS (ABSOLUTE): 0.4 x10E3/uL (ref 0.0–0.4)
Eos: 6 %
Hematocrit: 41.5 % (ref 37.5–51.0)
Hemoglobin: 14 g/dL (ref 13.0–17.7)
Immature Grans (Abs): 0 x10E3/uL (ref 0.0–0.1)
Immature Granulocytes: 0 %
Lymphocytes Absolute: 1.6 x10E3/uL (ref 0.7–3.1)
Lymphs: 28 %
MCH: 31.6 pg (ref 26.6–33.0)
MCHC: 33.7 g/dL (ref 31.5–35.7)
MCV: 94 fL (ref 79–97)
Monocytes Absolute: 0.6 x10E3/uL (ref 0.1–0.9)
Monocytes: 10 %
Neutrophils Absolute: 3.2 x10E3/uL (ref 1.4–7.0)
Neutrophils: 55 %
Platelets: 184 x10E3/uL (ref 150–450)
RBC: 4.43 x10E6/uL (ref 4.14–5.80)
RDW: 12.8 % (ref 11.6–15.4)
WBC: 5.9 x10E3/uL (ref 3.4–10.8)

## 2024-06-16 LAB — HEMOGLOBIN A1C
Est. average glucose Bld gHb Est-mCnc: 120 mg/dL
Hgb A1c MFr Bld: 5.8 % — ABNORMAL HIGH (ref 4.8–5.6)

## 2024-06-16 LAB — VITAMIN B1: Thiamine: 112.1 nmol/L (ref 66.5–200.0)

## 2024-06-16 LAB — TSH+FREE T4
Free T4: 0.95 ng/dL (ref 0.82–1.77)
TSH: 1.62 u[IU]/mL (ref 0.450–4.500)

## 2024-06-19 ENCOUNTER — Telehealth: Payer: Self-pay

## 2024-06-19 DIAGNOSIS — E669 Obesity, unspecified: Secondary | ICD-10-CM

## 2024-06-19 DIAGNOSIS — G4730 Sleep apnea, unspecified: Secondary | ICD-10-CM

## 2024-06-19 NOTE — Telephone Encounter (Signed)
 Copied from CRM [id number]. Topic: General - Other >> Jun 19, 2024  4:25 PM Drema MATSU wrote: Reason for CRM: Eric with [facility] was calling to confirm patient phone number for home sleep test for sleep apnea. He stated that they have not been able to reach him and he wants to know can clinic let him know that they are trying to reach him.

## 2024-07-10 ENCOUNTER — Telehealth: Payer: Self-pay

## 2024-07-10 ENCOUNTER — Other Ambulatory Visit (HOSPITAL_COMMUNITY): Payer: Self-pay

## 2024-07-10 NOTE — Telephone Encounter (Signed)
 Copied from CRM #[id number]. Topic: Clinical - Medication Prior Auth >> Jul 10, 2024  1:25 PM Sophia H wrote: Reason for CRM:  Patient is calling in today regarding tirzepatide  (ZEPBOUND ) 2.5 MG/0.5ML Pen. Patient states pharmacy advised that a PA is required a long with more documentation as to why he is needing the medication. Patient states he is also okay with upping the dose if PCP is okay with that. Please advise # [phone].  States he is supposed to have his injection today and was not able to get it at pharmacy due to issue with insurance.   [facility] [address], [place] - [address] >> Jul 10, 2024  3:12 PM Dedra B wrote: Pt called to follow up on this PA for Zepbound . Pls call pt.

## 2024-07-13 ENCOUNTER — Telehealth: Payer: Self-pay

## 2024-07-13 ENCOUNTER — Other Ambulatory Visit (HOSPITAL_COMMUNITY): Payer: Self-pay

## 2024-07-13 MED ORDER — ZEPBOUND 5 MG/0.5ML ~~LOC~~ SOAJ
5.0000 mg | SUBCUTANEOUS | 0 refills | Status: DC
Start: 2024-07-13 — End: 2024-08-10

## 2024-07-13 NOTE — Telephone Encounter (Signed)
 Called and spoke with patient.  His zepbound  is not approved at pharamcy  Was told he would need a PA for the 2.5mg  but he was told the 5.0mg  would not need a PA  In speaking with patient. He is wanting to make the increase to the 5mg  - states he did not have any s/e or problems with the 2.5mg  strength Last written 06/11/2024 Last OV 06/11/2024 Upcoming appt=07/20/2024

## 2024-07-13 NOTE — Telephone Encounter (Signed)
 Copied from CRM [id number]. Topic: General - Other >> Jul 10, 2024  4:44 PM Santiya F wrote: Reason for CRM: Patient is calling in because he needs to speak with his provider Benton Gave about his medication Zepbound . Patient says he just needs to speak with her for about five minutes. He is wanting to know if she can call him when she can on Monday. Please advise.

## 2024-07-13 NOTE — Telephone Encounter (Signed)
 Forwarded this message in separate message to provider for [employer] and spoke with patient.  His zepbound  is not approved at pharamcy  Was told he would need a PA for the 2.5mg  but he was told the 5.0mg  would not need a PA  In speaking with patient. He is wanting to make the increase to the 5mg  - states he did not have any s/e or problems with the 2.5mg  strength Last written 06/11/2024 Last OV 06/11/2024 Upcoming appt=07/20/2024

## 2024-07-13 NOTE — Addendum Note (Signed)
 Addended by: Dairon Procter on: 07/13/2024 11:54 AM   Modules accepted: Orders

## 2024-07-14 NOTE — Telephone Encounter (Signed)
 Eft message for a return call from patient regarding medication.

## 2024-07-20 ENCOUNTER — Ambulatory Visit: Admitting: Urgent Care

## 2024-07-20 VITALS — BP 100/65 | HR 92 | Ht 73.0 in | Wt 281.0 lb

## 2024-07-20 DIAGNOSIS — E785 Hyperlipidemia, unspecified: Secondary | ICD-10-CM

## 2024-07-20 DIAGNOSIS — L603 Nail dystrophy: Secondary | ICD-10-CM | POA: Diagnosis not present

## 2024-07-20 DIAGNOSIS — R7303 Prediabetes: Secondary | ICD-10-CM | POA: Diagnosis not present

## 2024-07-20 DIAGNOSIS — Z6837 Body mass index (BMI) 37.0-37.9, adult: Secondary | ICD-10-CM

## 2024-07-20 NOTE — Patient Instructions (Signed)
 Please continue to monitor your weight. Send me your weight in one month prior to next refill. We will likely keep you on the 5mg  dose. Also monitor BP at home, we do not want it to get any lower.  Please return in 4 months, sooner as needed

## 2024-07-20 NOTE — Progress Notes (Unsigned)
 Established Patient Office Visit  Subjective:  Patient ID: Jose Jimenez, male    DOB: 1960-12-18  Age: 63 y.o. MRN: [id number]  Chief Complaint  Patient presents with   Follow-up    Heartburn on zepbound     HPI  Patient Active Problem List   Diagnosis Date Noted   Elevated PSA 05/29/2023   BPH with obstruction/lower urinary tract symptoms 05/29/2023   Chronic cough 05/13/2023   Nocturia 05/13/2023   Gout 12/05/2021   Skin lesion 12/05/2021   Dyslipidemia (high LDL; low HDL) 09/05/2021   Great toe pain, left 09/05/2021   Erectile dysfunction 09/04/2021   Atypical chest pain 02/03/2020   Onychodystrophy likely secondary to onychomycosis 12/18/2019   Verruca 12/18/2019   Shift work sleep disorder 09/15/2019   Right lumbar radiculitis 01/14/2019   Primary osteoarthritis of left knee 11/14/2017   Bilateral lower extremity edema 05/15/2016   Excessive daytime sleepiness 01/16/2016   Obstructive uropathy 11/15/2014   Hyperlipidemia 03/16/2013   Male hypogonadism 03/16/2013   Annual physical exam 03/13/2013   History of [facility] spotted fever 03/13/2013   Obesity 03/13/2013   Past Medical History:  Diagnosis Date   Arthritis    Hyperlipidemia    Past Surgical History:  Procedure Laterality Date   APPENDECTOMY     Social History   Tobacco Use   Smoking status: Former   Smokeless tobacco: Never  Substance Use Topics   Alcohol use: No   Drug use: No      ROS: as noted in HPI  Objective:     BP 100/65   Pulse 92   Ht 6' 1 (1.854 m)   Wt 281 lb (127.5 kg)   SpO2 94%   BMI 37.07 kg/m  BP Readings from Last 3 Encounters:  07/20/24 100/65  06/11/24 113/76  02/27/24 118/82   Wt Readings from Last 3 Encounters:  07/20/24 281 lb (127.5 kg)  06/11/24 296 lb 8 oz (134.5 kg)  02/27/24 292 lb (132.5 kg)      Physical Exam   No results found for any visits on 07/20/24.  Last CBC Lab Results  Component Value Date   WBC 5.9 06/11/2024   HGB  14.0 06/11/2024   HCT 41.5 06/11/2024   MCV 94 06/11/2024   MCH 31.6 06/11/2024   RDW 12.8 06/11/2024   PLT 184 06/11/2024   Last metabolic panel Lab Results  Component Value Date   GLUCOSE 98 06/11/2024   NA 136 06/11/2024   K 4.3 06/11/2024   CL 103 06/11/2024   CO2 20 06/11/2024   BUN 12 06/11/2024   CREATININE 0.83 06/11/2024   EGFR 98 06/11/2024   CALCIUM  8.8 06/11/2024   PROT 6.0 06/11/2024   ALBUMIN 4.0 06/11/2024   LABGLOB 2.0 06/11/2024   BILITOT 0.5 06/11/2024   ALKPHOS 75 06/11/2024   AST 15 06/11/2024   ALT 17 06/11/2024   Last lipids Lab Results  Component Value Date   CHOL 140 06/11/2024   HDL 36 (L) 06/11/2024   LDLCALC 81 06/11/2024   TRIG 128 06/11/2024   CHOLHDL 3.9 06/11/2024   Last hemoglobin A1c Lab Results  Component Value Date   HGBA1C 5.8 (H) 06/11/2024   Last thyroid functions Lab Results  Component Value Date   TSH 1.620 06/11/2024   Last vitamin D  Lab Results  Component Value Date   VD25OH 23 (L) 02/04/2020   Last vitamin B12 and Folate Lab Results  Component Value Date   VITAMINB12 458 06/11/2024  FOLATE 10.6 06/11/2024      The 10-year ASCVD risk score (Arnett DK, et al., 2019) is: 6.7%  Assessment & Plan:  Hyperlipidemia, unspecified hyperlipidemia type  Onychodystrophy  BMI 37.0-37.9, adult     No follow-ups on file.   Benton LITTIE Gave, PA

## 2024-07-21 ENCOUNTER — Encounter: Payer: Self-pay | Admitting: Urgent Care

## 2024-07-21 NOTE — Telephone Encounter (Signed)
Patient was seen in office and this was discussed.

## 2024-07-22 ENCOUNTER — Ambulatory Visit: Payer: Self-pay | Admitting: Urgent Care

## 2024-07-22 NOTE — Telephone Encounter (Signed)
 Left message for a return call

## 2024-07-22 NOTE — Telephone Encounter (Signed)
 Copied from CRM [id number]. Topic: General - Other >> Jul 22, 2024  2:13 PM Fonda T wrote: Reason for CRM: Received call from patient, states she is returning a missed call to office, to [place].  Per chart review, message left for patient to return call to discuss sleep study results.   Called office, no answer.  Patient can be reached back at [phone].  Patient is aware  of same day call back.

## 2024-08-08 ENCOUNTER — Encounter: Payer: Self-pay | Admitting: Urgent Care

## 2024-08-08 DIAGNOSIS — Z6837 Body mass index (BMI) 37.0-37.9, adult: Secondary | ICD-10-CM

## 2024-08-08 DIAGNOSIS — E785 Hyperlipidemia, unspecified: Secondary | ICD-10-CM

## 2024-08-10 MED ORDER — ZEPBOUND 7.5 MG/0.5ML ~~LOC~~ SOAJ: 7.5000 mg | SUBCUTANEOUS | 3 refills | Status: DC

## 2024-08-11 ENCOUNTER — Ambulatory Visit: Admitting: Urology

## 2024-08-13 ENCOUNTER — Telehealth: Payer: Self-pay | Admitting: Urology

## 2024-08-13 ENCOUNTER — Other Ambulatory Visit: Payer: Self-pay

## 2024-08-13 DIAGNOSIS — N138 Other obstructive and reflux uropathy: Secondary | ICD-10-CM

## 2024-08-13 MED ORDER — ALFUZOSIN HCL ER 10 MG PO TB24: 10.0000 mg | ORAL_TABLET | Freq: Every day | ORAL | 1 refills | Status: DC

## 2024-08-13 NOTE — Telephone Encounter (Signed)
 SABRA

## 2024-08-13 NOTE — Telephone Encounter (Signed)
 I called Jose Jimenez to move his appt with Dr. Roseann on 08/27/24 due to provider being on call. He stated that he would be out of his two medications and is requesting a refill until his follow up on 09/07/24. He needs Alfuzosion 10mg  And Finasteride  5mg  Called into the [facility] in [place]

## 2024-08-13 NOTE — Telephone Encounter (Signed)
Refill sent   Patient notified

## 2024-08-27 ENCOUNTER — Ambulatory Visit: Admitting: Urology

## 2024-09-01 MED ORDER — ROSUVASTATIN CALCIUM 10 MG PO TABS: 10.0000 mg | ORAL_TABLET | Freq: Every day | ORAL | 3 refills | Status: AC

## 2024-09-07 ENCOUNTER — Encounter: Payer: Self-pay | Admitting: Urology

## 2024-09-07 ENCOUNTER — Ambulatory Visit (INDEPENDENT_AMBULATORY_CARE_PROVIDER_SITE_OTHER): Admitting: Urology

## 2024-09-07 VITALS — BP 120/82 | HR 74 | Ht 73.0 in | Wt 273.0 lb

## 2024-09-07 DIAGNOSIS — R3912 Poor urinary stream: Secondary | ICD-10-CM

## 2024-09-07 DIAGNOSIS — N529 Male erectile dysfunction, unspecified: Secondary | ICD-10-CM | POA: Insufficient documentation

## 2024-09-07 DIAGNOSIS — N401 Enlarged prostate with lower urinary tract symptoms: Secondary | ICD-10-CM

## 2024-09-07 DIAGNOSIS — R972 Elevated prostate specific antigen [PSA]: Secondary | ICD-10-CM | POA: Diagnosis not present

## 2024-09-07 DIAGNOSIS — N138 Other obstructive and reflux uropathy: Secondary | ICD-10-CM

## 2024-09-07 DIAGNOSIS — R3915 Urgency of urination: Secondary | ICD-10-CM

## 2024-09-07 DIAGNOSIS — R351 Nocturia: Secondary | ICD-10-CM

## 2024-09-07 LAB — URINALYSIS, ROUTINE W REFLEX MICROSCOPIC
Bilirubin, UA: NEGATIVE
Glucose, UA: NEGATIVE
Ketones, UA: NEGATIVE
Leukocytes,UA: NEGATIVE
Nitrite, UA: NEGATIVE
Protein,UA: NEGATIVE
RBC, UA: NEGATIVE
Specific Gravity, UA: 1.02 (ref 1.005–1.030)
Urobilinogen, Ur: 0.2 mg/dL (ref 0.2–1.0)
pH, UA: 5.5 (ref 5.0–7.5)

## 2024-09-07 MED ORDER — ALFUZOSIN HCL ER 10 MG PO TB24: 10.0000 mg | ORAL_TABLET | Freq: Every day | ORAL | 11 refills | Status: AC

## 2024-09-07 MED ORDER — TADALAFIL 20 MG PO TABS
20.0000 mg | ORAL_TABLET | Freq: Every day | ORAL | 11 refills | Status: AC | PRN
Start: 2024-09-07 — End: ?

## 2024-09-07 NOTE — Progress Notes (Addendum)
 Assessment: 1. BPH with obstruction/lower urinary tract symptoms   2. Elevated PSA   3. Organic impotence     Plan: PSA today Continue alfuzosin  10 mg daily for BPH with LUTS.   Continue finasteride  5 mg daily. Trial of tadalafil  20 mg prn.  Rx sent. Return to office in 6 months  Chief Complaint:  Chief Complaint  Patient presents with   Benign Prostatic Hypertrophy    History of Present Illness:  [name] is a 63 y.o. male who is seen for further evaluation of elevated PSA and BPH with lower urinary tract symptoms.  PSA results: 7/24 5.1 11/22 3.6 4/21 1.3 6/17 2.97  Iso PSA 8/24:  4.79 with isoPSA of 7.8.  No history of elevated PSA.  No history of UTIs or prostatitis.  He has a family history of prostate cancer with his father.  He was on testosterone  replacement therapy with testosterone  injections weekly.  He discontinued this due to to his PSA increase.  He he reported lower urinary tract symptoms including frequency, urgency, nocturia 2-3 times, decreased force of stream, and intermittent stream.  No dysuria or gross hematuria. IPSS = 14 QOL = 4. He was given a trial of alfuzosin  at his visit in 8/24.  He underwent a transrectal ultrasound and biopsy of the prostate on 07/24/2023. TRUS volume:  73.5 ml  Biopsy results: Benign prostate tissue with chronic inflammation; high-grade PIN in 1 core.  4K test 4/25: Assay 3.90, 4K score 1.5 indicating low risk of aggressive prostate cancer on biopsy.  His visit in April 2025, he continued on alfuzosin  for his BPH with lower urinary tract symptoms.  He had actually been taking this twice a day for unknown reasons.  He reported that his urinary symptoms were fairly stable.  He continued to have some frequency and intermittent stream as well as a decreased force of stream.  No dysuria or gross hematuria.  He returns today for follow-up.  He continues on alfuzosin .  He reports that his lower urinary tract symptoms  are fairly stable.  He continues to have some urgency, weak stream and nocturia 2-3 times.  His symptoms are more noticeable at night.  No dysuria or gross hematuria. IPSS = 15/3. He also reports some difficulty achieving and maintaining his erections.  He also has premature ejaculation.  He has previously tried sildenafil  but has a significant headache associated with the medication.  He has not tried tadalafil .   Portions of the above documentation were copied from a prior visit for review purposes only.   Past Medical History:  Past Medical History:  Diagnosis Date   Arthritis    Hyperlipidemia     Past Surgical History:  Past Surgical History:  Procedure Laterality Date   APPENDECTOMY      Allergies:  No Known Allergies  Family History:  Family History  Problem Relation Age of Onset   Prostate cancer Father     Social History:  Social History   Tobacco Use   Smoking status: Former   Smokeless tobacco: Never  Substance Use Topics   Alcohol use: No   Drug use: No    ROS: Constitutional:  Negative for fever, chills, weight loss CV: Negative for chest pain, previous MI, hypertension Respiratory:  Negative for shortness of breath, wheezing, sleep apnea, frequent cough GI:  Negative for nausea, vomiting, bloody stool, GERD  Physical exam: BP 120/82   Pulse 74   Ht 6' 1 (1.854 m)   Wt 273  lb (123.8 kg)   BMI 36.02 kg/m  GENERAL APPEARANCE:  Well appearing, well developed, well nourished, NAD HEENT:  Atraumatic, normocephalic, oropharynx clear NECK:  Supple without lymphadenopathy or thyromegaly ABDOMEN:  Soft, non-tender, no masses EXTREMITIES:  Moves all extremities well, without clubbing, cyanosis, or edema NEUROLOGIC:  Alert and oriented x 3, normal gait, CN II-XII grossly intact MENTAL STATUS:  appropriate BACK:  Non-tender to palpation, No CVAT SKIN:  Warm, dry, and intact   Results: U/A: negative

## 2024-09-08 ENCOUNTER — Ambulatory Visit: Payer: Self-pay | Admitting: Urology

## 2024-09-08 LAB — PSA: Prostate Specific Ag, Serum: 3.7 ng/mL (ref 0.0–4.0)

## 2024-09-25 MED ORDER — ZEPBOUND 7.5 MG/0.5ML ~~LOC~~ SOAJ: 7.5000 mg | SUBCUTANEOUS | 11 refills | Status: AC

## 2024-09-25 NOTE — Addendum Note (Signed)
 Addended by: Aariya Ferrick on: 09/25/2024 12:00 PM   Modules accepted: Orders

## 2024-10-12 ENCOUNTER — Telehealth: Payer: Self-pay | Admitting: Pharmacy Technician

## 2024-10-12 ENCOUNTER — Other Ambulatory Visit (HOSPITAL_COMMUNITY): Payer: Self-pay

## 2024-10-12 NOTE — Telephone Encounter (Signed)
 Pharmacy Patient Advocate Encounter   Received notification from [employer] that prior authorization for Zepbound  is due for renewal.   Insurance verification completed.   The patient is insured through [employer].  Action: Refill too soon. PA is not needed at this time. Medication was filled 09/25/2024. Next eligible fill date is 10/18/2024.

## 2024-11-10 ENCOUNTER — Encounter: Payer: Self-pay | Admitting: Urgent Care

## 2024-11-10 DIAGNOSIS — S39012A Strain of muscle, fascia and tendon of lower back, initial encounter: Secondary | ICD-10-CM | POA: Diagnosis not present

## 2024-11-11 ENCOUNTER — Encounter: Payer: Self-pay | Admitting: Urgent Care

## 2024-11-11 MED ORDER — TIZANIDINE HCL 4 MG PO TABS: 4.0000 mg | ORAL_TABLET | Freq: Four times a day (QID) | ORAL | 0 refills | Status: AC | PRN

## 2024-11-11 NOTE — Telephone Encounter (Signed)
